# Patient Record
Sex: Male | Born: 1965 | Race: White | Hispanic: No | Smoking: Former smoker
Health system: Southern US, Community
[De-identification: ages and names within clinical notes are randomized; demographics above are authoritative.]

## PROBLEM LIST (undated history)

## (undated) DIAGNOSIS — F419 Anxiety disorder, unspecified: Secondary | ICD-10-CM

## (undated) DIAGNOSIS — D496 Neoplasm of unspecified behavior of brain: Secondary | ICD-10-CM

## (undated) DIAGNOSIS — I10 Essential (primary) hypertension: Secondary | ICD-10-CM

---

## 2003-01-01 ENCOUNTER — Emergency Department (HOSPITAL_COMMUNITY): Admission: EM | Admit: 2003-01-01 | Discharge: 2003-01-02 | Payer: Self-pay | Admitting: Emergency Medicine

## 2003-01-02 ENCOUNTER — Encounter: Payer: Self-pay | Admitting: Emergency Medicine

## 2003-01-08 ENCOUNTER — Emergency Department (HOSPITAL_COMMUNITY): Admission: EM | Admit: 2003-01-08 | Discharge: 2003-01-09 | Payer: Self-pay | Admitting: Emergency Medicine

## 2003-10-19 ENCOUNTER — Ambulatory Visit (HOSPITAL_COMMUNITY): Admission: RE | Admit: 2003-10-19 | Discharge: 2003-10-19 | Payer: Self-pay | Admitting: Family Medicine

## 2003-10-20 ENCOUNTER — Emergency Department (HOSPITAL_COMMUNITY): Admission: EM | Admit: 2003-10-20 | Discharge: 2003-10-20 | Payer: Self-pay

## 2003-10-20 ENCOUNTER — Inpatient Hospital Stay (HOSPITAL_COMMUNITY): Admission: AD | Admit: 2003-10-20 | Discharge: 2003-10-23 | Payer: Self-pay | Admitting: Internal Medicine

## 2003-10-21 ENCOUNTER — Encounter (INDEPENDENT_AMBULATORY_CARE_PROVIDER_SITE_OTHER): Payer: Self-pay | Admitting: *Deleted

## 2003-11-29 ENCOUNTER — Emergency Department (HOSPITAL_COMMUNITY): Admission: EM | Admit: 2003-11-29 | Discharge: 2003-11-29 | Payer: Self-pay | Admitting: Emergency Medicine

## 2003-12-10 ENCOUNTER — Ambulatory Visit (HOSPITAL_COMMUNITY): Admission: RE | Admit: 2003-12-10 | Discharge: 2003-12-10 | Payer: Self-pay | Admitting: Family Medicine

## 2003-12-11 ENCOUNTER — Emergency Department (HOSPITAL_COMMUNITY): Admission: EM | Admit: 2003-12-11 | Discharge: 2003-12-12 | Payer: Self-pay | Admitting: Emergency Medicine

## 2004-01-09 ENCOUNTER — Emergency Department (HOSPITAL_COMMUNITY): Admission: EM | Admit: 2004-01-09 | Discharge: 2004-01-10 | Payer: Self-pay

## 2004-01-10 ENCOUNTER — Emergency Department (HOSPITAL_COMMUNITY): Admission: EM | Admit: 2004-01-10 | Discharge: 2004-01-11 | Payer: Self-pay | Admitting: Emergency Medicine

## 2004-01-18 ENCOUNTER — Emergency Department (HOSPITAL_COMMUNITY): Admission: EM | Admit: 2004-01-18 | Discharge: 2004-01-18 | Payer: Self-pay | Admitting: Emergency Medicine

## 2004-02-03 ENCOUNTER — Emergency Department (HOSPITAL_COMMUNITY): Admission: EM | Admit: 2004-02-03 | Discharge: 2004-02-04 | Payer: Self-pay | Admitting: Emergency Medicine

## 2004-02-26 ENCOUNTER — Emergency Department (HOSPITAL_COMMUNITY): Admission: EM | Admit: 2004-02-26 | Discharge: 2004-02-27 | Payer: Self-pay | Admitting: Emergency Medicine

## 2004-05-18 ENCOUNTER — Emergency Department (HOSPITAL_COMMUNITY): Admission: EM | Admit: 2004-05-18 | Discharge: 2004-05-19 | Payer: Self-pay | Admitting: Emergency Medicine

## 2004-09-09 ENCOUNTER — Emergency Department (HOSPITAL_COMMUNITY): Admission: EM | Admit: 2004-09-09 | Discharge: 2004-09-09 | Payer: Self-pay | Admitting: Emergency Medicine

## 2006-05-16 ENCOUNTER — Emergency Department (HOSPITAL_COMMUNITY): Admission: EM | Admit: 2006-05-16 | Discharge: 2006-05-16 | Payer: Self-pay | Admitting: Emergency Medicine

## 2008-07-31 ENCOUNTER — Emergency Department (HOSPITAL_COMMUNITY): Admission: EM | Admit: 2008-07-31 | Discharge: 2008-07-31 | Payer: Self-pay | Admitting: Emergency Medicine

## 2008-09-30 ENCOUNTER — Emergency Department (HOSPITAL_COMMUNITY): Admission: EM | Admit: 2008-09-30 | Discharge: 2008-09-30 | Payer: Self-pay | Admitting: Emergency Medicine

## 2010-04-04 ENCOUNTER — Emergency Department (HOSPITAL_COMMUNITY)
Admission: EM | Admit: 2010-04-04 | Discharge: 2010-04-04 | Payer: Self-pay | Source: Home / Self Care | Admitting: Emergency Medicine

## 2010-06-28 ENCOUNTER — Encounter: Payer: Self-pay | Admitting: Family Medicine

## 2010-10-23 NOTE — H&P (Signed)
NAME:  Maurice Clay, WIENEKE                         ACCOUNT NO.:  [id number]   MEDICAL RECORD NO.:  [id number]                   PATIENT TYPE:  EMS   LOCATION:  MAJO                                 FACILITY:  [facility]   PHYSICIAN:  Candyce Churn, M.D.          DATE OF BIRTH:  1966/05/20   DATE OF ADMISSION:  10/20/2003  DATE OF DISCHARGE:                                HISTORY & PHYSICAL   CHIEF COMPLAINT:  Severe headache.   HISTORY OF PRESENT ILLNESS:  Maurice Clay is a very pleasant 45 year old male  with approximately two months of progressive headaches who now is having  episodic confusion, which is increasing and a low grade problem with  organizing thoughts. Initially was felt to have poor memory, but thinks  recently he has also had some auditory hallucinations. He saw Dr. Dorothe Pea  two days prior to admission and was given Percocet or Dilaudid for headache  and MRI was ordered. MRI had an abnormal right temporal lobe, which is not  entirely consistent with neoplasm and may be an inflammatory infiltrative  process. He is admitted now for pain control, further workup, and treatment  of nausea and vomiting.   PAST MEDICAL HISTORY:  1. Mild depression in the distant past. He was recently restarted on Elavil     for a depressed mood.  2. Episodic low back strain.  3. Tobacco use - the patient is a 40-pack-year smoker and continues to     smoke.   MEDICATIONS:  1. Klonopin 1 mg 1-2 times a day.  2. Dilaudid 2 mg p.r.n.  3. Elavil 25 mg at night for the last five to six months.  4. Atenolol 100 mg a day, which he did not tolerate well and now he was     given Verapamil 80 mg t.i.d. apparently two days ago, but he has not     filled this prescription yet.   ALLERGIES:  No known drug allergies.   PAST SURGICAL HISTORY:  None.   FAMILY HISTORY:  Father has metastatic melanoma. Two brothers and sisters  alive and well. Mother is okay.   POINT OF CONTACT:  His brother Barbara Cower.  He can be reached at telephone number  [phone].   SOCIAL HISTORY:  The patient is divorced. He has a girlfriend. He smokes as  above. He has rare alcohol use currently, somewhat heavy in the past. No  recreational drugs. 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   REVIEW OF SYSTEMS:  He has severe bitemporal headache. No fevers. He has an  occasional night sweat. His appetite is good. Denies double or blurred  vision. He did have some temporary numbness in his right upper extremity one  night recently, but resolved by the next day. No ataxia. Otherwise as per  HPI. Denies neck stiffness. No abdominal pain. No incontinence of bladder or  bowel. The patient denies any testicular lumps or nodules.   PHYSICAL  EXAMINATION:  GENERAL:  An alert male in no acute distress.  VITAL SIGNS: Temperature 97.8, pulse 98 and regular, respiratory rate 20 and  easy, blood pressure 120/79.  HEENT:  Atraumatic, normocephalic. Oropharynx is clear.  NECK:  Supple without mass or JVD. No lymphadenopathy noted.  CHEST:  Clear to auscultation.  CARDIAC:  Regular rhythm. No murmur, rub, or gallop.  ABDOMEN:  Soft, nontender. No hepatosplenomegaly.  EXTREMITIES:  Without cyanosis, clubbing, or edema.  NEUROLOGIC:  Oriented x3. Cranial nerves 2-12 intact. No obvious focal  deficits.   LABORATORY DATA:  Chest x-ray pending. CBC, CMET, urinalysis, PT/PTT, sed.  rate, VDRL, PPD, and LDH pending. MRI of the brain reveals right temporal  lobe area of possible inflammation versus neoplasm.   ASSESSMENT:  A 45 year old male with headaches and right temporal lobe  abnormality, questionable etiology. He is having some cognitive difficulties  and some peripheral symptoms.   I doubt HSV encephalitis in that he is not febrile and not paraplegic, and  not having any seizures. Could this be CNS lymphoma? Wonder if this may be  an infiltrative process.   PLAN:  1. Neurological consultation, Dr. Sandria Manly.  2. IV  Toradol for pain.  3. IV Phenergan for nausea.  4. X-ray, labs as above.  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                                                Candyce Churn, M.D.    RNG/MEDQ  D:  10/21/2003  T:  10/21/2003  Job:  [id number]   cc:   Jethro Bastos, M.D.  [address]  [place]  [place] [address]  Fax: [phone]   Genene Churn. Love, M.D.  [address]  [address]  [place]  [place] [address]  Fax: [phone]

## 2010-10-23 NOTE — Consult Note (Signed)
NAME:  Maurice Clay, Maurice Clay                         ACCOUNT NO.:  [id number]   MEDICAL RECORD NO.:  [id number]                   PATIENT TYPE:  INP   LOCATION:  3034                                 FACILITY:  [facility]   PHYSICIAN:  Genene Churn. Love, M.D.                 DATE OF BIRTH:  Dec 18, 1965   DATE OF CONSULTATION:  DATE OF DISCHARGE:                                   CONSULTATION   HISTORY OF PRESENT ILLNESS:  This is the first [facility] admission  for this 45 year old right-handed white single male with many years of  headaches, seen for evaluation of abnormal MRI's of the brain.   HISTORY OF PRESENT ILLNESS:  Mr. Maurice Clay has at least a 10-12 year history  of headaches, followed by Dr. Dorothe Pea, but much worse in the last 3 months.  Treated with verapamil 80 mg per day, Elavil 75 mg per day, Klonopin 1 mg  b.i.d., and this weekend oxycodone 1-2 q.4-6h. p.r.n. headache pain.  He  takes over-the-counter medications, at least 30 tablets per month, and it is  over the last 3 months.  He had a significant increase in headaches, which  he relates to stress.  He and his wife are getting divorced, and he has lost  visitation right-sided, and has had to go to court on many different  occasions.  He was seen by Dr. Dorothe Pea, who recommended an MRI study of the  brain, which was performed on Saturday evening, Oct 19, 2003, showing  evidence of a mass lesion in the right mid-temporal region without any  associated enhancement.  This could represent a herpes simplex encephalitis,  hamartoma, or possibly low-grade growing tumor.  The patient has had no  history of seizures, fever, hemiparesis, aphasia, ___________ syndrome, etc.  He denies any head or neck trauma.   PAST MEDICAL HISTORY:  He has a past history of heavy alcohol use, but has  not been drinking in many years.  He continues to smoke cigarettes, 2 packs  per day.  Otherwise, his health has been quite good.   MEDICATIONS:  1.  Verapamil 80 mg daily.  2. Klonopin 1 mg b.i.d.  3. Elavil 75 mg q.h.s.  4. This weekend oxycodone.   He has not had any strange odors, tastes, smells, deja vu, _____________, or  symptoms of seizures.  He has no other risk factors for tumor.   PHYSICAL EXAMINATION:  GENERAL:  A well-developed white male.  Thin.  VITAL SIGNS:  Blood pressure in the right and left arm of 130/80, heart rate  64 and regular.  NEUROLOGIC:  Mental status alert and oriented x3.  Followed 3-step commands.  Cranial nerve examination--visual fields full, disks flat.  Spontaneous  venous pulsation.  Extraocular movements full.  Corneal's present.  Facial  sensation, motor and hearing intact.  Air conduction greater than bone  conduction.  Tongue midline.  Uvula midline.  Gag is present.  Sternocleidomastoid and trapezius testing normal.  Motor examination with  5/5 strength proximally and distally in the left lower extremities.  No  evidence of proximal pronator or distal drift.  Coordination testing normal.  Sensory examination intact to pinprick, light touch, vibration testing.  Deep tendon reflexes were 1 to 2+.  Plantar response is downing.  Gait  examination with normal tandem toe and heel walking and Romberg testing  negative.   MEDICAL REVIEW OF SYSTEMS:  History of panic attack characterized by  sensations in which he needs to do things right away, and he will stop what  he is doing to go ahead and do them.   IMPRESSION:  1. 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  2. Migraine, code 346.0.  3. Abnormal MRI study of the brain, hamartoma versus tumor versus herpes     versus benign lesion, code 794.09.   PLAN:  The plan at this time is to repeat chest x-ray, because the initial  one showed question of pneumothorax, and obtain an MRI, spectroscopy and  EEG.                                               Genene Churn. Sandria Manly, M.D.    JML/MEDQ  D:  10/20/2003  T:  10/21/2003  Job:  [id number]

## 2010-10-23 NOTE — Discharge Summary (Signed)
NAME:  Maurice Clay, Maurice Clay                         ACCOUNT NO.:  [id number]   MEDICAL RECORD NO.:  [id number]                   PATIENT TYPE:  INP   LOCATION:  3034                                 FACILITY:  [facility]   PHYSICIAN:  Melissa L. Ladona Ridgel, MD               DATE OF BIRTH:  07-09-1965   DATE OF ADMISSION:  10/20/2003  DATE OF DISCHARGE:  10/23/2003                                 DISCHARGE SUMMARY   ADMITTING DIAGNOSES:  1. Severe headache with an abnormal MRI.  2. Mild depression.  3. Tobacco abuse.   DISCHARGE DIAGNOSES:  1. Temporal lobe lesion of unknown significance, possible neoplasm needs to     be ruled out with repeat study over time.  2. Migraine disease.  3. Tobacco abuse, at baseline.   MEDICATIONS AT TIME OF DISCHARGE:  1. Amitriptyline 25 mg 3 tablets nightly.  2. Atenolol 100 mg was to be started, however, this is on hold and the     patient will check with Dr. Jethro Bastos regarding this.  3. Klonopin 1 mg nightly.  4. Verapamil 80 mg once daily; this will also be held until the patient     speaks with Dr. Dorothe Pea.   FOLLOWUP:  His followup appointment is with Dr. Genene Churn. Love in 3 months  and with Dr. Dorothe Pea in 1 week to review the CSF studies.   HISTORY OF PRESENT ILLNESS:  The patient is a 45 year old white male who  presented to his primary care physician to work up progressively worsening  headaches and episodic confusion.  The patient's family noted that he was  having trouble organizing his thoughts and his memory was deteriorating.  Dr. Dorothe Pea prescribed appropriate pain medication and ordered an MRI.  The  MRI revealed an abnormal right temporal lobe lesion which could not be  differentiated between neoplasm versus an infiltrative process.  He  therefore was admitted to the hospital for further workup of his lesion and  control of his pain, as well as nausea and vomiting.   HOSPITAL COURSE:  The patient was admitted to the general medical  floor,  where he was seen by neurology.  The neurologist performed the spinal lumbar  puncture and appropriate labs were sent off to rule out inflammatory  process.  At the time of discharge, all had returned negative.  He underwent  an EEG which showed no signs or symptoms of seizure activity.  He underwent  chest x-ray which showed hyperinflation, chronic apical changes but no  infiltrate.  Initially, a tiny pneumothorax was thought to have been  present, however, repeat study showed that there was nothing along the lines  of a pneumothorax.  Further tests completed during the course of study were  an MR spectroscopy.  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  It was decided that the patient would follow up  as an outpatient to further elucidate the nature of his lesion.  At the time  of discharge, the patient's pertinent laboratory values were a TSH of 3.6.  His initial CSF evaluation was colorless, clear, 0 red cells, 2 white cells,  too-few-to-count neutrophils.  His oligoclonal band analysis was all within  normal limits.  Protein in the CSF was 34, glucose was 60, albumin was not  observed.  VDRL was nonreactive and the ACE study was 1.6, which was within  normal limits.  Blood cultures and fluid cultures remained negative.  His  herpes simplex PCR returned negative post discharge.  The CSF analysis for  cytology showed no malignant cells.  On the day of discharge, the patient's  vital signs were stable.  His general physical exam revealed normocephalic,  atraumatic, in no acute distress.  He denied headache.  His pupils were  equal, round and reactive to light.  Extraocular movements were intact and  mucous membranes were moist.  His neck was supple.  There was no JVD and no  lymph nodes.  Chest was clear to auscultation but distant.  Abdomen was  soft, nontender and  nondistended with positive bowel sounds.  There was no  hepatosplenomegaly.  There was no peripheral edema.  The patient had  multiple tattoos.  His neurological exam was nonfocal and he appeared  oriented to time, place and person.  The patient was instructed on the  importance of following up with Dr. Sandria Manly and his primary care physician, as  the formal diagnosis of a brain tumor had not been reached but was a serious  consideration in his case.                                                Melissa L. Ladona Ridgel, MD    MLT/MEDQ  D:  11/07/2003  T:  11/09/2003  Job:  [id number]   cc:   Jethro Bastos, M.D.  [address]  [place]  [place] [address]  Fax: [phone]   Genene Churn. Love, M.D.  [address]  [address]  [place]  [place] [address]  Fax: [phone]

## 2010-10-23 NOTE — Procedures (Signed)
REFERRING PHYSICIANS:  1. Hollice Espy, M.D.  2. Genene Churn. Love, M.D.   Rule out seizure of other abnormalities in a 45 year old with headaches and  abnormality on MRI in the right temporal lobe.   This is a routine 17-channel EEG with one channel devoted to EKG utilizing  the international 10/20 lead placement system.  The patient was awake during  the study clinically and electrographically.  The background consisted of  well-organized, well-developed, well-modulated 10 Hz alpha activity which is  predominant in posterior head region and reactive to eye opening as well as  to photic stimulation at several flash frequencies.  There was also an  underlying beta activity noted throughout the study which may be related to  medication.  Some artifact was noted from the P4 electrode but that was  cleared up shortly into the study.  Photic stimulation produced photic  driving occipitally at several flash frequencies, hyperventilation did not  produce significant change in background activity.  No interhemispheric  asymmetry is identified and no definite epileptiform discharges are noted.  No irritability is noted from the right temporal region.  EKG monitor  reveals relatively regular rhythm with a rate of 60 beats per minute.   CONCLUSION:  Essentially normal awake EEG without focal abnormality or  seizure like activity seen during the course of today's recording.  There  was some beta activity  noted which is likely related to medication.  Clinical correlation is recommended.    Tyler Deis, M.D.   ZOX:WRUE  D:  10/21/2003 12:36:04  T:  10/21/2003 14:12:20  Job #:  [id number]   cc:   Genene Churn. Love, M.D.  [address]  [address]  [place]  [place] [address]  Fax: [phone]

## 2010-10-23 NOTE — Op Note (Signed)
NAME:  Maurice Clay, Maurice Clay                         ACCOUNT NO.:  [id number]   MEDICAL RECORD NO.:  [id number]                   PATIENT TYPE:  INP   LOCATION:  3034                                 FACILITY:  [facility]   PHYSICIAN:  Santina Evans A. Orlin Hilding, M.D.          DATE OF BIRTH:  Dec 15, 1965   DATE OF PROCEDURE:  10/21/2003  DATE OF DISCHARGE:                                 OPERATIVE REPORT   INDICATIONS FOR PROCEDURE:  This is a 45 year old with an abnormality in the  right mediotemporal lobe by MRI with headaches.  MRI spectroscopy was  completed, but results are pending.   PROCEDURE:  Lumbar puncture.   PREOPERATIVE DIAGNOSIS:  Diagnostic, rule out herpes simplex encephalitis,  neoplasm, etc.   The procedure was explained along with risks of possible headache and  benefits of potential diagnosis.  Consent was obtained.   DESCRIPTION OF PROCEDURE:  The patient was sterilely prepped and draped and  positioned in the usual fashion in the left lateral decubitus position.  The  L4-5 level was identified and locally anesthetized with 1% Xylocaine.  The  thecal sac was entered without difficulty with an opening pressure of 17.  Approximately 12 mL of clear, colorless, fluid were obtained and sent to the  lab for cell count, differential, protein, glucose, VDRL, ACE level, PCR for  herpes simplex virus, gram stain culture, oligoclonal band, IgG, albumin  index, cytology.  The patient tolerated the procedure well with no immediate  complications.  Will lie in Trendelenburg for 30 minutes.                                               Catherine A. Orlin Hilding, M.D.    CAW/MEDQ  D:  10/21/2003  T:  10/21/2003  Job:  [id number]   cc:   Jethro Bastos, M.D.  [address]  [place]  [place] [address]  Fax: [phone]

## 2011-03-23 ENCOUNTER — Emergency Department (HOSPITAL_COMMUNITY): Payer: Self-pay

## 2011-03-23 ENCOUNTER — Emergency Department (HOSPITAL_COMMUNITY)
Admission: EM | Admit: 2011-03-23 | Discharge: 2011-03-23 | Disposition: A | Discharge status: Home / Self Care | Payer: Self-pay | Attending: Emergency Medicine | Admitting: Emergency Medicine

## 2011-03-23 DIAGNOSIS — D496 Neoplasm of unspecified behavior of brain: Secondary | ICD-10-CM | POA: Insufficient documentation

## 2011-03-23 DIAGNOSIS — R52 Pain, unspecified: Secondary | ICD-10-CM | POA: Insufficient documentation

## 2011-03-23 DIAGNOSIS — R51 Headache: Secondary | ICD-10-CM | POA: Insufficient documentation

## 2011-03-23 DIAGNOSIS — R4182 Altered mental status, unspecified: Secondary | ICD-10-CM | POA: Insufficient documentation

## 2011-03-23 DIAGNOSIS — R062 Wheezing: Secondary | ICD-10-CM | POA: Insufficient documentation

## 2011-03-23 LAB — DIFFERENTIAL
Basophils Absolute: 0.1 10*3/uL (ref 0.0–0.1)
Eosinophils Relative: 4 % (ref 0–5)
Lymphs Abs: 3.2 10*3/uL (ref 0.7–4.0)

## 2011-03-23 LAB — COMPREHENSIVE METABOLIC PANEL
ALT: <5 U/L (ref 0–53)
CO2: 27 mEq/L (ref 19–32)
Chloride: 102 mEq/L (ref 96–112)
GFR calc non Af Amer: >90 mL/min (ref >90)
Glucose, Bld: 82 mg/dL (ref 70–99)
Potassium: 3.6 mEq/L (ref 3.5–5.1)
Total Protein: 7.4 g/dL (ref 6.0–8.3)

## 2011-03-23 LAB — URINALYSIS, ROUTINE W REFLEX MICROSCOPIC
Glucose, UA: NEGATIVE mg/dL
Hgb urine dipstick: NEGATIVE
Specific Gravity, Urine: 1.013 (ref 1.005–1.030)
Urobilinogen, UA: 0.2 mg/dL (ref 0.0–1.0)

## 2011-03-23 LAB — ACETAMINOPHEN LEVEL: Acetaminophen (Tylenol), Serum: <15 ug/mL (ref 10–30)

## 2011-03-23 LAB — CBC
HCT: 37.9 % — ABNORMAL LOW (ref 39.0–52.0)
Hemoglobin: 12.3 g/dL — ABNORMAL LOW (ref 13.0–17.0)
MCH: 28.6 pg (ref 26.0–34.0)
Platelets: 275 10*3/uL (ref 150–400)
RBC: 4.3 MIL/uL (ref 4.22–5.81)

## 2011-03-23 LAB — RAPID URINE DRUG SCREEN, HOSP PERFORMED: Benzodiazepines: NOT DETECTED

## 2011-03-23 LAB — POCT I-STAT TROPONIN I: Troponin i, poc: 0 ng/mL (ref 0.00–0.08)

## 2011-03-23 LAB — POCT I-STAT, CHEM 8
Chloride: 105 mEq/L (ref 96–112)
Creatinine, Ser: 0.8 mg/dL (ref 0.50–1.35)
HCT: 43 % (ref 39.0–52.0)
Hemoglobin: 14.6 g/dL (ref 13.0–17.0)
Sodium: 140 mEq/L (ref 135–145)

## 2011-03-23 MED ORDER — GADOBENATE DIMEGLUMINE 529 MG/ML IV SOLN
12.0000 mL | Freq: Once | INTRAVENOUS | Status: AC | PRN
  Administered 2011-03-23: 12 mL via INTRAVENOUS

## 2012-10-18 ENCOUNTER — Inpatient Hospital Stay (HOSPITAL_BASED_OUTPATIENT_CLINIC_OR_DEPARTMENT_OTHER)
Admission: EM | Admit: 2012-10-18 | Discharge: 2012-10-20 | DRG: 896 | Disposition: A | Discharge status: Home / Self Care | Payer: MEDICAID | Attending: Internal Medicine | Admitting: Internal Medicine

## 2012-10-18 ENCOUNTER — Emergency Department (HOSPITAL_BASED_OUTPATIENT_CLINIC_OR_DEPARTMENT_OTHER): Payer: Self-pay

## 2012-10-18 ENCOUNTER — Encounter (HOSPITAL_BASED_OUTPATIENT_CLINIC_OR_DEPARTMENT_OTHER): Payer: Self-pay | Admitting: *Deleted

## 2012-10-18 DIAGNOSIS — E860 Dehydration: Secondary | ICD-10-CM | POA: Diagnosis present

## 2012-10-18 DIAGNOSIS — G43109 Migraine with aura, not intractable, without status migrainosus: Secondary | ICD-10-CM | POA: Diagnosis present

## 2012-10-18 DIAGNOSIS — R220 Localized swelling, mass and lump, head: Secondary | ICD-10-CM | POA: Diagnosis present

## 2012-10-18 DIAGNOSIS — F17200 Nicotine dependence, unspecified, uncomplicated: Secondary | ICD-10-CM | POA: Diagnosis present

## 2012-10-18 DIAGNOSIS — G9340 Encephalopathy, unspecified: Secondary | ICD-10-CM

## 2012-10-18 DIAGNOSIS — I10 Essential (primary) hypertension: Secondary | ICD-10-CM | POA: Diagnosis present

## 2012-10-18 DIAGNOSIS — G939 Disorder of brain, unspecified: Secondary | ICD-10-CM

## 2012-10-18 DIAGNOSIS — R569 Unspecified convulsions: Secondary | ICD-10-CM

## 2012-10-18 DIAGNOSIS — R410 Disorientation, unspecified: Secondary | ICD-10-CM

## 2012-10-18 DIAGNOSIS — F19921 Other psychoactive substance use, unspecified with intoxication with delirium: Principal | ICD-10-CM | POA: Diagnosis present

## 2012-10-18 DIAGNOSIS — IMO0002 Reserved for concepts with insufficient information to code with codable children: Secondary | ICD-10-CM | POA: Diagnosis present

## 2012-10-18 DIAGNOSIS — Z9114 Patient's other noncompliance with medication regimen: Secondary | ICD-10-CM

## 2012-10-18 DIAGNOSIS — D72829 Elevated white blood cell count, unspecified: Secondary | ICD-10-CM | POA: Diagnosis present

## 2012-10-18 DIAGNOSIS — T380X1A Poisoning by glucocorticoids and synthetic analogues, accidental (unintentional), initial encounter: Secondary | ICD-10-CM | POA: Diagnosis present

## 2012-10-18 DIAGNOSIS — Z9119 Patient's noncompliance with other medical treatment and regimen: Secondary | ICD-10-CM

## 2012-10-18 DIAGNOSIS — F319 Bipolar disorder, unspecified: Secondary | ICD-10-CM | POA: Diagnosis present

## 2012-10-18 DIAGNOSIS — Z91199 Patient's noncompliance with other medical treatment and regimen due to unspecified reason: Secondary | ICD-10-CM

## 2012-10-18 DIAGNOSIS — G9389 Other specified disorders of brain: Secondary | ICD-10-CM

## 2012-10-18 DIAGNOSIS — R451 Restlessness and agitation: Secondary | ICD-10-CM

## 2012-10-18 DIAGNOSIS — R4182 Altered mental status, unspecified: Secondary | ICD-10-CM

## 2012-10-18 HISTORY — DX: Essential (primary) hypertension: I10

## 2012-10-18 HISTORY — DX: Neoplasm of unspecified behavior of brain: D49.6

## 2012-10-18 LAB — CBC WITH DIFFERENTIAL/PLATELET
Basophils Relative: 1 % (ref 0–1)
HCT: 39.4 % (ref 39.0–52.0)
Lymphocytes Relative: 39 % (ref 12–46)
MCHC: 35 g/dL (ref 30.0–36.0)
Monocytes Absolute: 0.7 10*3/uL (ref 0.1–1.0)
Neutro Abs: 4.4 10*3/uL (ref 1.7–7.7)
RBC: 4.57 MIL/uL (ref 4.22–5.81)
RDW: 14.1 % (ref 11.5–15.5)
WBC: 9 10*3/uL (ref 4.0–10.5)

## 2012-10-18 LAB — COMPREHENSIVE METABOLIC PANEL
ALT: 6 U/L (ref 0–53)
AST: 19 U/L (ref 0–37)
Alkaline Phosphatase: 66 U/L (ref 39–117)
Calcium: 9.7 mg/dL (ref 8.4–10.5)
Chloride: 103 mEq/L (ref 96–112)
Glucose, Bld: 110 mg/dL — ABNORMAL HIGH (ref 70–99)
Potassium: 4.1 mEq/L (ref 3.5–5.1)
Total Bilirubin: 0.2 mg/dL — ABNORMAL LOW (ref 0.3–1.2)

## 2012-10-18 LAB — LACTIC ACID, PLASMA: Lactic Acid, Venous: 0.9 mmol/L (ref 0.5–2.2)

## 2012-10-18 LAB — URINALYSIS, ROUTINE W REFLEX MICROSCOPIC

## 2012-10-18 LAB — ETHANOL: Alcohol, Ethyl (B): <11 mg/dL (ref 0–11)

## 2012-10-18 MED ORDER — DEXAMETHASONE SODIUM PHOSPHATE 10 MG/ML IJ SOLN
10.0000 mg | Freq: Once | INTRAMUSCULAR | Status: AC
  Administered 2012-10-18: 10 mg via INTRAVENOUS
  Filled 2012-10-18: qty 1

## 2012-10-18 MED ORDER — KETOROLAC TROMETHAMINE 15 MG/ML IJ SOLN
15.0000 mg | Freq: Once | INTRAMUSCULAR | Status: AC
  Administered 2012-10-18: 15 mg via INTRAVENOUS
  Filled 2012-10-18: qty 1

## 2012-10-18 MED ORDER — PROMETHAZINE HCL 25 MG/ML IJ SOLN
25.0000 mg | Freq: Once | INTRAMUSCULAR | Status: AC
  Administered 2012-10-18: 25 mg via INTRAVENOUS
  Filled 2012-10-18: qty 1

## 2012-10-18 NOTE — ED Notes (Signed)
Patient transported to CT 

## 2012-10-18 NOTE — ED Notes (Signed)
Pt reports h/a , toothache , sister states confusion x 1 day

## 2012-10-18 NOTE — ED Provider Notes (Signed)
History     CSN: [id number]  Arrival date & time 10/18/12  2144   First MD Initiated Contact with Patient 10/18/12 2301      Chief Complaint  Patient presents with  . Altered Mental Status  . Dental Pain  . Headache   HPI Maurice Clay is a 47 y.o. male presents with altered mental status and confusion times one day. 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  Last position was seen 3 years ago.  Patient is fully functional, he lives by himself, he holds a job at the same employer or his sister works. Recently, over the last day, she has noted in acting more strangely, and "talking out of his head."  Patient has made reference to seeing things that are not there, and inappropriate comments based on the conversation at hand. Patient has no complaints about chest pain, shortness of breath, productive cough, dysuria, abdominal pain, nausea, vomiting, diarrhea, or rash. Patient has complained about tooth pain and headache. Patient's headache is throbbing, on the right side of the head, similar to prior headaches in the past, mild photophobia.  Past Medical History  Diagnosis Date  . Brain tumor   . Hypertension     History reviewed. No pertinent past surgical history.  History reviewed. No pertinent family history.  History  Substance Use Topics  . Smoking status: Current Every Day Smoker -- 0.50 packs/day    Types: Cigarettes  . Smokeless tobacco: Not on file  . Alcohol Use: No      Review of Systems At least 10pt or greater review of systems completed and are negative except where specified in the HPI.  Allergies  Tramadol  Home Medications  No current outpatient prescriptions on file.  BP 106/65  Pulse 91  Temp(Src) 98.6 F (37 C) (Oral)  Resp 16  Ht 5\' 10"  (1.778 m)  Wt 130 lb (58.968 kg)  BMI 18.65  kg/m2  SpO2 100%  Physical Exam  PHYSICAL EXAM: VITAL SIGNS:  . Filed Vitals:   10/19/12 0155 10/19/12 0300 10/19/12 0309 10/19/12 0325  BP: 120/72 110/72 122/80   Pulse: 111 108 102 74  Temp:  98.5 F (36.9 C)    TempSrc:      Resp: 24 22 24    Height:      Weight:      SpO2: 98% 98% 98% 94%   CONSTITUTIONAL: Awake, orientedx2, appears non-toxic HENT: Atraumatic, normocephalic, oral mucosa pink and moist, airway patent. Poor dentition. Nares patent without drainage. External ears normal. EYES: Conjunctiva clear, EOMI, PERRLA NECK: Trachea midline, non-tender, supple CARDIOVASCULAR: Normal heart rate, Normal rhythm, No murmurs, rubs, gallops PULMONARY/CHEST: Clear to auscultation, no rhonchi, wheezes, or rales. Symmetrical breath sounds. CHEST WALL: No lesions. Non-tender. ABDOMINAL: Non-distended, soft, non-tender - no rebound or guarding.  BS normal. NEUROLOGIC: NW:GNFAOZ fields intact. PERRLA, EOMI.  Facial sensation equal to light touch bilaterally.  Good muscle bulk in the masseter muscle and good lateral movement of the jaw.  Facial expressions equal and good strength with smile/frown and puffed cheeks.  Hearing grossly intact to finger rub test.  Uvula, tongue are midline with no deviation. Symmetrical palate elevation.  Trapezius and SCM muscles are 5/5 strength bilaterally.   DTR: Brachioradialis, biceps, patellar, Achilles tendon reflexes 2+ bilaterally.  No clonus. Strength: 5/5 strength flexors and extensors in the  upper and lower extremities.  Grip strength, finger adduction/abduction 5/5. Sensation: Sensation intact distally to light touch, proprioception using position testing of 2nd digit and great toe Cerebellar: No ataxia with walking - has a wider based gait than expected , no dysmetria with finger to nose, rapid alternating hand movements and heels to shin testing. Gait and Station: Patient is able to walk on heels and the toes, unable to tandem gait EXTREMITIES:  No clubbing, cyanosis, or edema SKIN: Warm, Dry, No erythema, No rash Psychiatric: Flat affect, mood congruent, occasional tangential or inappropriate statements  ED Course  Procedures (including critical care time) CRITICAL CARE Performed by: Jones Skene Total critical care time: 45 Critical care time was exclusive of separately billable procedures and treating other patients. Critical care was necessary to treat or prevent imminent or life-threatening deterioration.  Altered mental status and delirium. Critical care was time spent personally by me on the following activities: development of treatment plan with patient and/or surrogate as well as nursing, discussions with consultants, evaluation of patient's response to treatment, examination of patient, obtaining history from patient or surrogate, ordering and performing treatments and interventions, ordering and review of laboratory studies, ordering and review of radiographic studies, pulse oximetry and re-evaluation of patient's condition.  Labs Reviewed  COMPREHENSIVE METABOLIC PANEL - Abnormal; Notable for the following:    Glucose, Bld 110 (*)    Total Bilirubin 0.2 (*)    All other components within normal limits  URINE RAPID DRUG SCREEN (HOSP PERFORMED) - Abnormal; Notable for the following:    Benzodiazepines POSITIVE (*)    All other components within normal limits  CBC WITH DIFFERENTIAL  URINALYSIS, ROUTINE W REFLEX MICROSCOPIC  ETHANOL  LACTIC ACID, PLASMA  AMMONIA   Ct Head Wo Contrast  10/18/2012   *RADIOLOGY REPORT*  Clinical Data: Altered mental status.  Headaches.  CT HEAD WITHOUT CONTRAST  Technique:  Contiguous axial images were obtained from the base of the skull through the vertex without contrast.  Comparison: 02/26/2004 CT.  MRI 03/23/2011  Findings: No acute intracranial abnormality.  Specifically, no hemorrhage, hydrocephalus, mass lesion, acute infarction, or significant intracranial injury.  No acute  calvarial abnormality. Mastoid air cells are clear.  Orbital soft tissues unremarkable.  IMPRESSION: No acute intracranial abnormality.   Original Report Authenticated By: Charlett Nose, M.D.     1. Altered mental status   2. Delirium   3. Brain lesion     Medications  ketorolac (TORADOL) 15 MG/ML injection 15 mg (15 mg Intravenous Given 10/18/12 2350)  dexamethasone (DECADRON) injection 10 mg (10 mg Intravenous Given 10/18/12 2350)  promethazine (PHENERGAN) injection 25 mg (25 mg Intravenous Given 10/18/12 2351)  lidocaine-EPINEPHrine (XYLOCAINE W/EPI) 2 %-1:100000 (with pres) injection 20 mL (20 mLs Infiltration Given 10/19/12 0018)  diphenhydrAMINE (BENADRYL) injection 25 mg (25 mg Intravenous Given 10/19/12 0017)  haloperidol lactate (HALDOL) injection 5 mg (5 mg Intravenous Given 10/19/12 0030)  haloperidol lactate (HALDOL) injection 5 mg (5 mg Intravenous Given 10/19/12 0112)  midazolam (VERSED) injection 2 mg (2 mg Intravenous Given 10/19/12 0112)  midazolam (VERSED) injection 2 mg (2 mg Intravenous Given 10/19/12 0120)  ziprasidone (GEODON) injection 10 mg (10 mg Intramuscular Given 10/19/12 0225)  midazolam (VERSED) injection 2 mg (2 mg Intravenous Given 10/19/12 0305)     MDM  Maurice Clay is a 47 y.o. male presenting with headache, altered mental status. Patient is alert, oriented to himself, knows he is in the emergency department and knows the year, patient is uncertain  of the date.  Patient had made strange comments to triage staff and will occasionally start talking to himself outside the conversation. He is known known history of bipolar disease, schizophrenia or visual hallucinations.  She does have a history of auditory hallucinations when he was first diagnosed with brain lesions in 2005.  In reviewing the patient's record, it is uncertain what kind of brain lesion he actually has, this could be a small low grade tumor and hippocampal region on the right versus encephalitis scar  tissue.  The patient has no symptoms of focal neurologic deficit, no symptoms of increased intracranial pressure. He is afebrile and nontoxic in appearance, I do not think he has got an acute encephalitis or meningitis-he has no meningeal symptoms.  Patient's labs are unremarkable, he's got a negative lactate, no evidence of infection by white count, urine, chest x-ray-CT of the head is read as normal.  Plan was to perform LP to definitively rule out meningitis versus encephalitis, however patient became increasingly delirious and agitated.  Patient became difficult to control, would not follow instructions by nursing staff, attempted to strike nursing staff, was determined to be a threat to himself and staff and so required chemical and physical restraints.  Patient has had some intermittent tachycardia, I think this is secondary to agitation, he is still afebrile, blood pressures have been within normal limits.  Discussed patient's admission with Dr. Alvester Morin, and I discussed a neuro consult with Dr. Roseanne Reno.  Patient is given Decadron for headache, Phenergan as well as his headache definitely sounded migraine-like as his headaches in the past have also.  Patient will require further inpatient management, diagnostics and treatment. Patient will be transferred to [facility] for admission to the step down unit.           Jones Skene, MD 10/19/12 1610

## 2012-10-19 ENCOUNTER — Emergency Department (HOSPITAL_BASED_OUTPATIENT_CLINIC_OR_DEPARTMENT_OTHER): Payer: Self-pay

## 2012-10-19 ENCOUNTER — Inpatient Hospital Stay (HOSPITAL_COMMUNITY): Payer: Self-pay

## 2012-10-19 DIAGNOSIS — R4182 Altered mental status, unspecified: Secondary | ICD-10-CM

## 2012-10-19 DIAGNOSIS — R569 Unspecified convulsions: Secondary | ICD-10-CM | POA: Diagnosis present

## 2012-10-19 DIAGNOSIS — R451 Restlessness and agitation: Secondary | ICD-10-CM | POA: Diagnosis present

## 2012-10-19 DIAGNOSIS — Z9114 Patient's other noncompliance with medication regimen: Secondary | ICD-10-CM

## 2012-10-19 DIAGNOSIS — D72829 Elevated white blood cell count, unspecified: Secondary | ICD-10-CM | POA: Diagnosis present

## 2012-10-19 DIAGNOSIS — I10 Essential (primary) hypertension: Secondary | ICD-10-CM | POA: Diagnosis present

## 2012-10-19 DIAGNOSIS — E860 Dehydration: Secondary | ICD-10-CM | POA: Diagnosis present

## 2012-10-19 DIAGNOSIS — R410 Disorientation, unspecified: Secondary | ICD-10-CM | POA: Diagnosis present

## 2012-10-19 DIAGNOSIS — Z91148 Patient's other noncompliance with medication regimen for other reason: Secondary | ICD-10-CM

## 2012-10-19 DIAGNOSIS — G9389 Other specified disorders of brain: Secondary | ICD-10-CM | POA: Diagnosis present

## 2012-10-19 DIAGNOSIS — G9340 Encephalopathy, unspecified: Secondary | ICD-10-CM | POA: Diagnosis present

## 2012-10-19 LAB — CREATININE, SERUM
GFR calc Af Amer: >90 mL/min (ref >90)
GFR calc non Af Amer: >90 mL/min (ref >90)

## 2012-10-19 LAB — CBC
MCV: 84.1 fL (ref 78.0–100.0)
Platelets: 242 10*3/uL (ref 150–400)
RDW: 14 % (ref 11.5–15.5)
WBC: 15.9 10*3/uL — ABNORMAL HIGH (ref 4.0–10.5)

## 2012-10-19 LAB — RAPID URINE DRUG SCREEN, HOSP PERFORMED
Amphetamines: NOT DETECTED
Benzodiazepines: POSITIVE — AB
Cocaine: NOT DETECTED

## 2012-10-19 MED ORDER — MIDAZOLAM HCL 2 MG/2ML IJ SOLN
INTRAMUSCULAR | Status: AC
  Administered 2012-10-19: 2 mg via INTRAVENOUS
  Filled 2012-10-19: qty 2

## 2012-10-19 MED ORDER — MIDAZOLAM HCL 2 MG/2ML IJ SOLN
INTRAMUSCULAR | Status: AC
  Filled 2012-10-19: qty 2

## 2012-10-19 MED ORDER — ZIPRASIDONE MESYLATE 20 MG IM SOLR
10.0000 mg | Freq: Once | INTRAMUSCULAR | Status: AC
  Administered 2012-10-19: 10 mg via INTRAMUSCULAR
  Filled 2012-10-19: qty 20

## 2012-10-19 MED ORDER — MIDAZOLAM HCL 2 MG/2ML IJ SOLN
2.0000 mg | Freq: Once | INTRAMUSCULAR | Status: AC
  Administered 2012-10-19: 2 mg via INTRAVENOUS
  Filled 2012-10-19: qty 2

## 2012-10-19 MED ORDER — HEPARIN SODIUM (PORCINE) 5000 UNIT/ML IJ SOLN
5000.0000 [IU] | Freq: Three times a day (TID) | INTRAMUSCULAR | Status: DC
  Administered 2012-10-19 – 2012-10-20 (×3): 5000 [IU] via SUBCUTANEOUS
  Filled 2012-10-19 (×6): qty 1

## 2012-10-19 MED ORDER — LIDOCAINE-EPINEPHRINE 2 %-1:100000 IJ SOLN
20.0000 mL | Freq: Once | INTRAMUSCULAR | Status: AC
  Administered 2012-10-19: 20 mL
  Filled 2012-10-19: qty 1

## 2012-10-19 MED ORDER — HALOPERIDOL LACTATE 5 MG/ML IJ SOLN
5.0000 mg | Freq: Once | INTRAMUSCULAR | Status: AC
  Administered 2012-10-19: 5 mg via INTRAVENOUS

## 2012-10-19 MED ORDER — ZIPRASIDONE MESYLATE 20 MG IM SOLR
INTRAMUSCULAR | Status: AC
  Filled 2012-10-19: qty 20

## 2012-10-19 MED ORDER — LACTATED RINGERS IV BOLUS (SEPSIS)
1000.0000 mL | Freq: Once | INTRAVENOUS | Status: AC
  Administered 2012-10-19: 1000 mL via INTRAVENOUS

## 2012-10-19 MED ORDER — SODIUM CHLORIDE 0.9 % IV SOLN
INTRAVENOUS | Status: DC
  Administered 2012-10-19: 07:00:00 via INTRAVENOUS
  Administered 2012-10-19: 100 mL/h via INTRAVENOUS

## 2012-10-19 MED ORDER — DIPHENHYDRAMINE HCL 50 MG/ML IJ SOLN
25.0000 mg | Freq: Once | INTRAMUSCULAR | Status: AC
  Administered 2012-10-19: 25 mg via INTRAVENOUS
  Filled 2012-10-19: qty 1

## 2012-10-19 MED ORDER — ZIPRASIDONE MESYLATE 20 MG IM SOLR
10.0000 mg | Freq: Once | INTRAMUSCULAR | Status: AC
  Administered 2012-10-19: 10 mg via INTRAMUSCULAR

## 2012-10-19 MED ORDER — HALOPERIDOL LACTATE 5 MG/ML IJ SOLN
INTRAMUSCULAR | Status: AC
  Administered 2012-10-19: 5 mg via INTRAVENOUS
  Filled 2012-10-19: qty 1

## 2012-10-19 MED ORDER — HALOPERIDOL LACTATE 5 MG/ML IJ SOLN
INTRAMUSCULAR | Status: AC
  Filled 2012-10-19: qty 1

## 2012-10-19 MED ORDER — THIAMINE HCL 100 MG/ML IJ SOLN
500.0000 mg | Freq: Three times a day (TID) | INTRAMUSCULAR | Status: DC
  Filled 2012-10-19 (×3): qty 5

## 2012-10-19 MED ORDER — MIDAZOLAM HCL 2 MG/2ML IJ SOLN: 2.0000 mg | Freq: Once | INTRAMUSCULAR | Status: AC

## 2012-10-19 MED ORDER — HALOPERIDOL LACTATE 5 MG/ML IJ SOLN: 5.0000 mg | Freq: Once | INTRAMUSCULAR | Status: AC

## 2012-10-19 MED ORDER — THIAMINE HCL 100 MG/ML IJ SOLN
500.0000 mg | Freq: Three times a day (TID) | INTRAVENOUS | Status: DC
  Administered 2012-10-19 – 2012-10-20 (×4): 500 mg via INTRAVENOUS
  Filled 2012-10-19 (×9): qty 5

## 2012-10-19 MED ORDER — NICOTINE 21 MG/24HR TD PT24
21.0000 mg | MEDICATED_PATCH | Freq: Every day | TRANSDERMAL | Status: DC
  Administered 2012-10-19 – 2012-10-20 (×2): 21 mg via TRANSDERMAL
  Filled 2012-10-19 (×3): qty 1

## 2012-10-19 MED ORDER — MIDAZOLAM HCL 2 MG/2ML IJ SOLN
2.0000 mg | Freq: Once | INTRAMUSCULAR | Status: AC
  Administered 2012-10-19: 2 mg via INTRAVENOUS

## 2012-10-19 NOTE — Progress Notes (Signed)
Utilization review completed.  

## 2012-10-19 NOTE — Progress Notes (Signed)
[facility]   Progress Note Maurice Clay TEAM 1 - Stepdown/ICU TEAM   Gery Sabedra EAV:[id number] DOB: 1966-02-26 DOA: 10/18/2012 PCP: No primary provider on file.  Brief narrative:  47 year old male patient with documented right temporal lobe lesion seen on MRI in 2005 year he also apparently bipolar disorder. Presented from local urgent care after being brought in by family because of confusion for about 24 hours. When he presented to urgent care he was confused but not aggressive or agitated. Immediately prior to onset of agitation an LP was planned but was unable to be completed because of patient's became acutely agitated and combative. He was aggressive enough that he required chemical and physical restraints. Upon presentation to [facility] he was afebrile and had no leukocytosis. At some point patient complained of headache and was given a migraine cocktail that included Decadron and this appears to have been given at the urgent care and prior to onset of patient's agitation symptoms.  Assessment/Plan: Active Problems:   Acute delirium/Agitation requiring sedation protocol/Acute encephalopathy -Required Geodon, Haldol and Versed in the emergency department  -sister endorses pt has had similar spells in past after increased personal stressors- had recent personal stress surrounding denial of access to his mother on Mother's Day -pt also has psych dx and did receive steroid prior to agitation so meds could have precipitated AMS AFTER presenationt to urgent care -alert and appropriate upon exam today though still anxious and wants to dc- no insight into current medical status   History Bipolar d/o -may need psych eval vs OP follow up    Dehydration/Leukocytosis -cont IVF- WBC resolved and no obvious source of infection appreciated -cont IVF and start diet since alert    Right temporal lobe mass/DX'D 2005 - ? New onset seizure -also had MS changes preadmit that could reflect  progression of known temporal lobe lesion or possible occult seizures -pt has not takem ANY meds for several years (see below) -appreciate neurology assistance -agree with plans for MRI and EEG   Forehead skin lesion -pt calls this "skin cancer" but has not been bx'd or diagnosed and has been present x 10 years -looks like area of repeated trauma with healed hypertrophied tissue border- likely bx at some point would be beneficial    History of  HTN (hypertension) -currently normotensive OFF meds- follow    Noncompliance with medication treatment due to underuse of medication -has itinerate work thru sister and no insurance -Case management consulted -likely needs disability so either needs primary psych or neuro OP to make determination and complete paperwork -last known med list is from 2005 admit: Elavil 25mg - 3 tabs HS, Atenolol held due to low BP, Klonopin 1 mg HS, Verapamil 80 mg daily also held- previously followed by Dorothe Pea (PCP) and Love (neuro) but has NO INSURANCE.   DVT prophylaxis: Subcutaneous heparin Code Status: Full Family Communication: Patient and sister at bedside Disposition Plan: Stepdown Isolation: None  Consultants: Neurology  Procedures: EEG  Antibiotics: None  HPI/Subjective: Earlier today patient was still somewhat sedated but later became alert and essentially oriented. Is becoming more agitated because of restraints so these were removed. Currently no complaints of chest pain or shortness of breath and no headache.   Objective: Blood pressure 117/65, pulse 74, temperature 98 F (36.7 C), temperature source Axillary, resp. rate 24, height 5\' 10"  (1.778 m), weight 58.968 kg (130 lb), SpO2 99.00%.  Intake/Output Summary (Last 24 hours) at 10/19/12 1443 Last data filed at 10/19/12 1300  Gross  per 24 hour  Intake    250 ml  Output    250 ml  Net      0 ml     Exam: Followup exam completed. Patient was admitted at 6:25 AM today   Data  Reviewed: Basic Metabolic Panel:  Recent Labs Lab 10/18/12 2200 10/19/12 0800  NA 141  --   K 4.1  --   CL 103  --   CO2 27  --   GLUCOSE 110*  --   BUN 11  --   CREATININE 0.80 0.74  CALCIUM 9.7  --    Liver Function Tests:  Recent Labs Lab 10/18/12 2200  AST 19  ALT 6  ALKPHOS 66  BILITOT 0.2*  PROT 7.2  ALBUMIN 3.9   No results found for this basename: LIPASE, AMYLASE,  in the last 168 hours  Recent Labs Lab 10/18/12 2349  AMMONIA 57   CBC:  Recent Labs Lab 10/18/12 2200 10/19/12 0800  WBC 9.0 15.9*  NEUTROABS 4.4  --   HGB 13.8 12.7*  HCT 39.4 36.1*  MCV 86.2 84.1  PLT 241 242   Cardiac Enzymes: No results found for this basename: CKTOTAL, CKMB, CKMBINDEX, TROPONINI,  in the last 168 hours BNP (last 3 results) No results found for this basename: PROBNP,  in the last 8760 hours CBG: No results found for this basename: GLUCAP,  in the last 168 hours  Recent Results (from the past 240 hour(s))  MRSA PCR SCREENING     Status: None   Collection Time    10/19/12  5:48 AM      Result Value Range Status   MRSA by PCR NEGATIVE  NEGATIVE Final   Comment:            The GeneXpert MRSA Assay (FDA     approved for NASAL specimens     only), is one component of a     comprehensive MRSA colonization     surveillance program. It is not     intended to diagnose MRSA     infection nor to guide or     monitor treatment for     MRSA infections.     Studies:  Recent x-ray studies have been reviewed in detail by the Attending Physician  Scheduled Meds:  Reviewed in detail by the Attending Physician   Sheralyn Boatman, ANP [facility] Office  [phone] Pager [phone]  On-Call/Text Page:      [email or web address]      password [email or web address]  If 7PM-7AM, please contact night-coverage [email or web address] Password [email or web address] 10/19/2012, 2:43 PM   LOS: 1 day   I have examined the patient, reviewed the chart and modified the above note which I agree with.    Brittany Osier,MD [phone] 10/19/2012, 5:14 PM

## 2012-10-19 NOTE — ED Notes (Signed)
Pt's sister is Milas Kocher [phone]

## 2012-10-19 NOTE — ED Notes (Signed)
Restraints continued per md request, + cms check, bathroom offered, pt refused, fluids offered pt refused, pt c/o being hot, pt was fanned with magazine and air temperature adjusted

## 2012-10-19 NOTE — Procedures (Signed)
History: 47 yo M with altered mental state  Background: The background consists of intermixed beta and alpha activities with mild delta activity as well. There is excessive beta activity .There is a well defined posterior dominant rhythm of 9.5 Hz that attenuates with eye opening. Sleep structures are observed with normal distribution.  Photic stimulation: Physiologic driving is not performed  EEG Abnormalities: 1) mild generalized slow activity  Clinical Interpretation: This mildly abnormal EEG is most consistent with a mild generalized non-specific cerebral dysfunction(encephalopathy). There was no seizure or seizure predisposition recorded on this study.   Ritta Slot, MD [facility] [phone]  If 7pm- 7am, please page neurology on call at [phone].

## 2012-10-19 NOTE — Evaluation (Signed)
Received call from ER physician from the med [facility] about patient with acute confusion x1 day. Patient with a baseline history of questionable temporal lobe mass dated back to 2005 that would need followup for questionable neoplasm. Patient has not had followup since this point. Patient was found to be confused at home. Workup thus far has been negative for any type of metabolic derangement or medication induced symptoms. Patient is benzo positive but does take these chronically. Hemodynamically stable. Per the ER physician, case was discussed with the Neuro hospitalist with recommendation for inpatient admission as well as MRI. Patient is currently being given Haldol to help with agitation with hopes to obtain an LP. Agree to admit patient for further workup.

## 2012-10-19 NOTE — Consult Note (Signed)
Reason for Consult: AMS Referring Physician: [place] Blas  CC: Altered Mental Status  History is obtained from:patient, medical record, nursing staff  HPI: Maurice Clay is a 47 y.o. male with a history of temporal lobe T2 change who presents with an episode of confusion. He initially described some headache(has history of headaches) and therefore a migraine cocktail was ordered. Following this, he became increasingly agitated and required restraints.   Per nursing staff, there is a reported history of similar episodes of confusion in the past.  He was given multiple rounds of anti-psychotics in addition ot the benadryl, phenergan and decadron he received last night and multiple rounds of versed.    ROS: A 14 point ROS was performed and is negative except as noted in the HPI.  Past Medical History  Diagnosis Date  . Brain tumor   . Hypertension     Family History: Unable to assess secondary to patient's altered mental status.    Social History: Tob: Unable to assess secondary to patient's altered mental status.    Exam: Current vital signs: BP 125/69  Pulse 74  Temp(Src) 98.5 F (36.9 C) (Oral)  Resp 24  Ht 5\' 10"  (1.778 m)  Wt 58.968 kg (130 lb)  BMI 18.65 kg/m2  SpO2 99% Vital signs in last 24 hours: Temp:  [98.5 F (36.9 C)-98.6 F (37 C)] 98.5 F (36.9 C) (05/15 0521) Pulse Rate:  [73-112] 74 (05/15 0325) Resp:  [16-24] 24 (05/15 0309) BP: (106-125)/(65-87) 125/69 mmHg (05/15 0521) SpO2:  [94 %-100 %] 99 % (05/15 0521) Weight:  [58.968 kg (130 lb)] 58.968 kg (130 lb) (05/14 2150)  General: in bed, in restraints. Thin appearing  CV: RRR Mental Status: Patient is awake, alert, oriented to person,  Year,but not place. When told [name] he was able to tell me [place] was "[place] [address] [place] [address]  He was able to address me by name, but after stepping out for a few minutes, he was unable to tell me my name or where he was.  Cranial Nerves: II: Blinks to threat  bilaterally. Pupils are equal, round, and reactive to light.  Discs are difficult to visualize due to patient cooperation.  III,IV, VI: does not clearly move his eyes laterally in either direction.  V: Facial sensation is symmetric to temperature VII: Facial movement is symmetric.  VIII: hearing is intact to voice X, XI, XII: Unable to assess secondary to patient's altered mental status.  Motor: Tone is normal. Bulk is normal. In restraints and cannot perform formal testing, but no clear weakness.  Sensory: Responds to nox stim x 4.  Deep Tendon Reflexes: 2+ and symmetric in the biceps and patellae.  Cerebellar: Unable to assess secondary to patient's altered mental status.  Gait: Unable to assess secondary to patient's altered mental status.   I have reviewed labs in epic and the results pertinent to this consultation are: Cbc unremarkable Ammonia - 57   I have reviewed the images obtained:CT head - negative  Impression: 47 yo M with AMS in the setting of previous temporal lobe abnormalities and h/o similar episodes. Currently, he continues to be confused, appearing delirious. Differential includes confusional migraine, delirium secondary to metabolic stressor(as yet unidentified) in a person predisposed due to temporal lobe mass, tumor progression, seizure. He also has a history of benzodiazepine use(prescribed) but abuse could explain confusion.   Recommendations: 1) Would check thiamine, then given the low risk of thiamine as intervention, would treat with high dose thiamine, 500mg   TID x 3 days.  2) Check TSH, RPR 3) EEG 4) MRI brain 5) Would consider using bezodiazepine for behavioral control if needed as withdrawal may be a consideration, though UDS did show presence of some.  6) Would try to D/C restraints once safe to do so.    Ritta Slot, MD [facility] [phone]  If 7pm- 7am, please page neurology on call at [phone].

## 2012-10-19 NOTE — ED Notes (Signed)
Pt continues to be agitated and attempting to pull out of restraints. Pt alert but disoriented.

## 2012-10-19 NOTE — H&P (Addendum)
Hospitalist Admission History and Physical  Patient name: Maurice Clay Medical record number: [id number] Date of birth: [birth year] Age: 47 y.o. Gender: male  Primary Care Provider: No primary provider on file.  Chief Complaint: AMS   History of Present Illness:This is a 47 y.o. year old male with significant past medical history of ? Temporal lobe mass and bipolar d/o  presenting with confusion x 1 day. Level V caveat as pt is currently agitated, confused and restrained. Per report, pt was noted by his family to be confused and hallucinating. Pt was brought to ER for further evaluation. Blood work and imaging was generally WNL including Head CT, CBC, ammonia and UDS. Pt was benzo + on UDS, but uses benzos chronically. Per report, case was discussed with neuro-hospitalist with recommendation for admission and MRI.  An LP was going to be attempted, however, became acutely agitated and combative at [facility] ER. Was medically and chemically restrained. Was unable to obtain LP. Pt afebrile on presentation to ER, no leukocytosis. Pt did initially complain of headache. Was given migraine cocktail, including decadron.  Pt with noted to have R temporal lobe mass in 2005 with planned sequential follow up to monitot. However, has not done so.   There are no active problems to display for this patient.  Past Medical History: Past Medical History  Diagnosis Date  . Brain tumor   . Hypertension     Past Surgical History: History reviewed. No pertinent past surgical history.  Social History: History   Social History  . Marital Status: Divorced    Spouse Name: N/A    Number of Children: N/A  . Years of Education: N/A   Social History Main Topics  . Smoking status: Current Every Day Smoker -- 0.50 packs/day    Types: Cigarettes  . Smokeless tobacco: None  . Alcohol Use: No  . Drug Use: No  . Sexually Active: No   Other Topics Concern  . None   Social History Narrative  . None    Family  History: History reviewed. No pertinent family history.  Allergies: Allergies  Allergen Reactions  . Tramadol     Current Facility-Administered Medications  Medication Dose Route Frequency Provider Last Rate Last Dose  . 0.9 %  sodium chloride infusion   Intravenous Continuous Doree Albee, MD      . heparin injection 5,000 Units  5,000 Units Subcutaneous Q8H Doree Albee, MD       Review Of Systems: 12 point ROS negative except as noted above in HPI.  Physical Exam: Filed Vitals:   10/19/12 0521  BP: 125/69  Pulse:   Temp: 98.5 F (36.9 C)  Resp:     General: restrained, agitated, confabulating , s/p haldol, versed, geodon HEENT: PERRLA and poor dentition Heart: S1, S2 normal, no murmur, rub or gallop, regular rate and rhythm Lungs: clear to auscultation, no wheezes or rales and unlabored breathing Abdomen: abdomen is soft without significant tenderness, masses, organomegaly or guarding Extremities: extremities normal, atraumatic, no cyanosis or edema Skin:no rashes Neurology: uncooperative to exam, no focal deficit noted   Labs and Imaging: Lab Results  Component Value Date/Time   NA 141 10/18/2012 10:00 PM   K 4.1 10/18/2012 10:00 PM   CL 103 10/18/2012 10:00 PM   CO2 27 10/18/2012 10:00 PM   BUN 11 10/18/2012 10:00 PM   CREATININE 0.80 10/18/2012 10:00 PM   GLUCOSE 110* 10/18/2012 10:00 PM   Lab Results  Component Value Date   WBC 9.0 10/18/2012  HGB 13.8 10/18/2012   HCT 39.4 10/18/2012   MCV 86.2 10/18/2012   PLT 241 10/18/2012   Drugs of Abuse     Component Value Date/Time   LABOPIA NONE DETECTED 10/18/2012 2339   COCAINSCRNUR NONE DETECTED 10/18/2012 2339   LABBENZ POSITIVE* 10/18/2012 2339   AMPHETMU NONE DETECTED 10/18/2012 2339   THCU NONE DETECTED 10/18/2012 2339   LABBARB NONE DETECTED 10/18/2012 2339    Urinalysis    Component Value Date/Time   COLORURINE YELLOW 10/18/2012 2338   APPEARANCEUR CLEAR 10/18/2012 2338   LABSPEC 1.014 10/18/2012 2338    PHURINE 7.5 10/18/2012 2338   GLUCOSEU NEGATIVE 10/18/2012 2338   HGBUR NEGATIVE 10/18/2012 2338   BILIRUBINUR NEGATIVE 10/18/2012 2338   KETONESUR NEGATIVE 10/18/2012 2338   PROTEINUR NEGATIVE 10/18/2012 2338   UROBILINOGEN 1.0 10/18/2012 2338   NITRITE NEGATIVE 10/18/2012 2338   LEUKOCYTESUR NEGATIVE 10/18/2012 2338       Ct Head Wo Contrast  10/18/2012   *RADIOLOGY REPORT*  Clinical Data: Altered mental status.  Headaches.  CT HEAD WITHOUT CONTRAST  Technique:  Contiguous axial images were obtained from the base of the skull through the vertex without contrast.  Comparison: 02/26/2004 CT.  MRI 03/23/2011  Findings: No acute intracranial abnormality.  Specifically, no hemorrhage, hydrocephalus, mass lesion, acute infarction, or significant intracranial injury.  No acute calvarial abnormality. Mastoid air cells are clear.  Orbital soft tissues unremarkable.  IMPRESSION: No acute intracranial abnormality.   Original Report Authenticated By: Charlett Nose, M.D.   Dg Chest Port 1 View  10/19/2012   *RADIOLOGY REPORT*  Clinical Data: Altered mental status.  PORTABLE CHEST - 1 VIEW  Comparison: 03/23/2011  Findings: Hyperinflation of the lungs.  Areas of scarring in the upper lobes bilaterally.  Diffuse interstitial prominence.  No confluent opacity.  No effusions.  No acute bony abnormality.  IMPRESSION: Stable hyperinflation and chronic changes.  No acute findings.   Original Report Authenticated By: Charlett Nose, M.D.     Assessment and Plan: Maurice Clay is a 47 y.o. year old male presenting with ams  Encephalopathy: Overall work up has been fairly benign thus far. Further progression of ? Temporal lobe mass vs. Psychiatric flare vs. Infection also on DDx. Neuro consult pending. Will obtain MRI brain with and without contrast pending consult. No clinical indications for abx currently. No mengismus, fever, leukocytosis. Follow up pending neuro recs.  FEN/GI: NPO  Prophylaxis: sub q heparin   Disposition: pending further evaluation  Code Status:full code       Doree Albee MD  Pager: [phone]

## 2012-10-19 NOTE — ED Notes (Signed)
Restraints continued, pt offered fluids and bathroom, denied both, family at bedside and updated on poc, + cms check

## 2012-10-19 NOTE — ED Notes (Signed)
At 0010, pt became agitated after receiving benadryl, toradol and phenegran as prescribed by md, pt started having kicking and swinging motion of arms and legs. Pt's sister and brother in law at patients bedside,  Upon arrival to bedside, 2 other RN's attempting to calm pt,

## 2012-10-19 NOTE — ED Notes (Signed)
Called to pt's room by family, stating that he was agitated and pulling at his IV. Dr. Rulon Abide at bedside and ordered benadryl.

## 2012-10-19 NOTE — ED Notes (Signed)
Restraints continued per md request, pt continues to fight staff, screaming and yelling, trying to pull iv's out, + cms check

## 2012-10-19 NOTE — ED Notes (Signed)
Restraints continued, + cms check,

## 2012-10-19 NOTE — Progress Notes (Signed)
EEG completed.

## 2012-10-19 NOTE — ED Notes (Signed)
After alternative attempts to calm patient and keep patient safe, order for 4 point restraints obtained from Dr. Rulon Abide, restraints applied to bilateral ankles and bilateral wrist, family at bedside and aware of use of restraints. Pt continued to attempt to get out of bed, swinging at staff, attempting to pull off medical equipment, pt is not alert and oriented at time and unable to follow directions

## 2012-10-19 NOTE — ED Notes (Signed)
Restraints continued per md request, pt continues to be aggressive toward staff, family at bedside for reassurance, pt oriented to self only, cms check wnl.

## 2012-10-20 ENCOUNTER — Inpatient Hospital Stay (HOSPITAL_COMMUNITY): Payer: MEDICAID

## 2012-10-20 LAB — COMPREHENSIVE METABOLIC PANEL
ALT: 9 U/L (ref 0–53)
Alkaline Phosphatase: 51 U/L (ref 39–117)
CO2: 24 mEq/L (ref 19–32)
Calcium: 8.4 mg/dL (ref 8.4–10.5)
Chloride: 109 mEq/L (ref 96–112)
GFR calc Af Amer: >90 mL/min (ref >90)
GFR calc non Af Amer: >90 mL/min (ref >90)
Glucose, Bld: 105 mg/dL — ABNORMAL HIGH (ref 70–99)
Sodium: 142 mEq/L (ref 135–145)
Total Bilirubin: 0.3 mg/dL (ref 0.3–1.2)

## 2012-10-20 LAB — CBC WITH DIFFERENTIAL/PLATELET
Basophils Absolute: 0 10*3/uL (ref 0.0–0.1)
Basophils Relative: 0 % (ref 0–1)
Eosinophils Absolute: 0.1 10*3/uL (ref 0.0–0.7)
Hemoglobin: 11.8 g/dL — ABNORMAL LOW (ref 13.0–17.0)
MCHC: 34.2 g/dL (ref 30.0–36.0)
Neutro Abs: 7.7 10*3/uL (ref 1.7–7.7)
Neutrophils Relative %: 59 % (ref 43–77)
Platelets: 224 10*3/uL (ref 150–400)
RDW: 14.4 % (ref 11.5–15.5)

## 2012-10-20 MED ORDER — AMITRIPTYLINE HCL 25 MG PO TABS: 25.0000 mg | ORAL_TABLET | Freq: Every day | ORAL | Status: DC

## 2012-10-20 MED ORDER — GADOBENATE DIMEGLUMINE 529 MG/ML IV SOLN
15.0000 mL | Freq: Once | INTRAVENOUS | Status: AC | PRN
  Administered 2012-10-20: 12 mL via INTRAVENOUS

## 2012-10-20 MED ORDER — IBUPROFEN 200 MG PO TABS: 200.0000 mg | ORAL_TABLET | Freq: Four times a day (QID) | ORAL | Status: DC | PRN

## 2012-10-20 MED ORDER — ACETAMINOPHEN 325 MG PO TABS: 650.0000 mg | ORAL_TABLET | Freq: Four times a day (QID) | ORAL | Status: DC | PRN

## 2012-10-20 MED ORDER — AMITRIPTYLINE HCL 25 MG PO TABS
25.0000 mg | ORAL_TABLET | Freq: Every day | ORAL | Status: DC
  Filled 2012-10-20: qty 1

## 2012-10-20 MED ORDER — KETOROLAC TROMETHAMINE 30 MG/ML IJ SOLN
30.0000 mg | Freq: Once | INTRAMUSCULAR | Status: AC
  Administered 2012-10-20: 30 mg via INTRAVENOUS
  Filled 2012-10-20: qty 1

## 2012-10-20 MED ORDER — IBUPROFEN 200 MG PO TABS
200.0000 mg | ORAL_TABLET | Freq: Four times a day (QID) | ORAL | Status: DC | PRN
  Filled 2012-10-20: qty 1

## 2012-10-20 NOTE — Discharge Summary (Addendum)
Physician Discharge Summary  Ceylon Arenson ZOX:[id number] DOB: 30-Jun-1965 DOA: 10/18/2012  PCP: No primary provider on file.  Admit date: 10/18/2012 Discharge date: 10/20/2012  Time spent: 30 minutes  Recommendations for Outpatient Follow-up:  1. Re-establish with Dr. Sandria Manly your previous Neurologist in regards to following up your confusion related to migraine headaches noting you were resumed on amitriptyline this admission.   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 New onset seizure-EEG negative   History of  HTN (hypertension)   Noncompliance with medication treatment due to underuse of medication   Discharge Condition: stable  Diet recommendation: Regular  Filed Weights   10/18/12 2150 10/20/12 0500  Weight: 58.968 kg (130 lb) 59 kg (130 lb 1.1 oz)    History of present illness:  47 year old male patient with documented right temporal lobe lesion seen on MRI in 2005. Presented after being brought in to urgent care by family because of confusion for about 24 hours. When he presented to urgent care he was confused but not aggressive or agitated. At some point while at urgent care the patient complained of headache and was given a migraine cocktail that included Decadron.  An LP was planned after this cocktail, but was unable to be completed because the patient became acutely agitated and combative. He was aggressive enough that he required chemical and physical restraints. Upon presentation [facility] he was found to be afebrile with no leukocytosis.    Hospital Course:   Acute delirium/Agitation requiring sedation protocol/Acute encephalopathy  -Required Geodon, Haldol and Versed in the emergency department   -sister endorsed pt has had similar spells in past after increased personal stressors - had recent personal stress surrounding denial of access to his mother on Mother's Day  -pt did receive steroid prior to agitation so this med likely precipitated AMS AFTER presenationt to urgent care  -alert and appropriate upon exam today  -clarified with patient no prior diagnosis of bipolar disorder  Right temporal lobe mass/DX'D 2005 - ? 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 and recommended resuming previously prescribed amitriptyline  Forehead skin lesion  -pt calls this "skin cancer" but has not been bx'd or diagnosed and has been present x 10 years  -looks like area of repeated trauma with healed hypertrophied tissue border - bx as an outpt would be beneficial  History of HTN  -normotensive OFF meds  Noncompliance with medication treatment due to underuse of medication  -has itinerate work thru sister and no insurance  -Case management consulted - generic Elavil very cheap and he can obtain as $4 script at [employer] or [employer] -likely needs disability so recommend follow up with neuro OP to make determination and complete paperwork   Procedures: EEG: mildly abnormal EEG is most consistent with a mild generalized non-specific cerebral dysfunction(encephalopathy). There was no seizure or seizure predisposition recorded on this study.   Consultations:  Neuro  Discharge Exam: Filed Vitals:   10/20/12 0344 10/20/12 0500 10/20/12  1610 10/20/12 1232  BP: 92/53  97/57   Pulse: 60     Temp: 97.5 F (36.4 C)  98.2 F (36.8 C) 98 F (36.7 C)  TempSrc:  Oral  Oral Oral  Resp: 17     Height:      Weight:  59 kg (130 lb 1.1 oz)    SpO2: 98%      General: No acute respiratory distress Lungs: Clear to auscultation bilaterally without wheezes or crackles, RA Cardiovascular: Regular rate and rhythm without murmur gallop or rub normal S1 and S2, no peripheral edema or JVD Abdomen: Nontender, nondistended, soft, bowel sounds positive, no rebound, no ascites, no appreciable mass Musculoskeletal: No significant cyanosis, clubbing of bilateral lower extremities Neurological: Alert and oriented x 3, moves all extremities x 4 without focal neurological deficits, CN 2-12 intact  Discharge Instructions      Discharge Orders   Future Orders Complete By Expires     Call MD for:  difficulty breathing, headache or visual disturbances  As directed     Call MD for:  persistant dizziness or light-headedness  As directed     Call MD for:  persistant nausea and vomiting  As directed     Call MD for:  severe uncontrolled pain  As directed     Call MD for:  temperature >100.4  As directed     Diet general  As directed     Increase activity slowly  As directed         Medication List    TAKE these medications       acetaminophen 325 MG tablet  Commonly known as:  TYLENOL  Take 2 tablets (650 mg total) by mouth every 6 (six) hours as needed.     amitriptyline 25 MG tablet  Commonly known as:  ELAVIL  Take 1 tablet (25 mg total) by mouth at bedtime.     ibuprofen 200 MG tablet  Commonly known as:  ADVIL,MOTRIN  Take 1 tablet (200 mg total) by mouth every 6 (six) hours as needed for headache.       Allergies  Allergen Reactions  . Tramadol    Follow-up Information   Follow up with Evie Lacks, MD. Schedule an appointment as soon as possible for a visit in 2 weeks. (call to make appointment to be seen in 2 weeks about your confusion migraine syndrome-and to discuss if your brain lesion qualifies you for disability)    Contact information:    [address] [place] [address] [phone]       The results of significant diagnostics from this hospitalization (including imaging, microbiology, ancillary and laboratory) are listed below for reference.    Significant Diagnostic Studies: Ct Head Wo Contrast  10/18/2012   *RADIOLOGY REPORT*  Clinical Data: Altered mental status.  Headaches.  CT HEAD WITHOUT CONTRAST  Technique:  Contiguous axial images were obtained from the base of the skull through the vertex without contrast.  Comparison: 02/26/2004 CT.  MRI 03/23/2011  Findings: No acute intracranial abnormality.  Specifically, no hemorrhage, hydrocephalus, mass lesion, acute infarction, or significant intracranial injury.  No acute calvarial abnormality. Mastoid air cells are clear.  Orbital soft tissues unremarkable.  IMPRESSION: No acute intracranial abnormality.   Original Report Authenticated By: Charlett Nose, M.D.   Mr Laqueta Jean Wo Contrast  10/20/2012   *RADIOLOGY REPORT*  Clinical Data: History of left temporal lesion.  Altered mental status.  Confusion.  Follow-up.  MRI HEAD WITHOUT AND  WITH CONTRAST  Technique:  Multiplanar, multiecho pulse sequences of the brain and surrounding structures were obtained according to standard protocol without and with intravenous contrast  Contrast: 12mL MULTIHANCE GADOBENATE DIMEGLUMINE 529 MG/ML IV SOLN  Comparison: Head CT 05/14.  MRI 03/23/2011.  MRI 12/10/2003.  Findings: Again demonstrated is slight enlargement and increased T2 / FLAIR signal within the mesial temporal lobe anteriorly on the right.  This has not changed since 2005.  Though abnormal, this is consistent with a very low grade or benign process such as hamartoma or very low grade glioma.  No contrast enhancement occurs.  The remainder the brain is normal.  No evidence of infarction, hydrocephalus or extra-axial collection.  No pituitary mass.  No significant sinus disease.  No skull or skull base lesion.  IMPRESSION:  Stable examination since 2005.  Slight enlargement at the anterior mesial temporal lobe on the right with slight increased FLAIR and T2 signal.  No contrast enhancement.  Lack of progression over that time frame argues for a benign process.   Original Report Authenticated By: Paulina Fusi, M.D.   Dg Chest Port 1 View  10/19/2012   *RADIOLOGY REPORT*  Clinical Data: Altered mental status.  PORTABLE CHEST - 1 VIEW  Comparison: 03/23/2011  Findings: Hyperinflation of the lungs.  Areas of scarring in the upper lobes bilaterally.  Diffuse interstitial prominence.  No confluent opacity.  No effusions.  No acute bony abnormality.  IMPRESSION: Stable hyperinflation and chronic changes.  No acute findings.   Original Report Authenticated By: Charlett Nose, M.D.    Microbiology: Recent Results (from the past 240 hour(s))  MRSA PCR SCREENING     Status: None   Collection Time    10/19/12  5:48 AM      Result Value Range Status   MRSA by PCR NEGATIVE  NEGATIVE Final   Comment:            The GeneXpert MRSA Assay (FDA     approved for NASAL specimens     only), is one component of a     comprehensive MRSA colonization     surveillance program. It is not     intended to diagnose MRSA     infection nor to guide or     monitor treatment for     MRSA infections.     Labs: Basic Metabolic Panel:  Recent Labs Lab 10/18/12 2200 10/19/12 0800 10/20/12 0430  NA 141  --  142  K 4.1  --  3.4*  CL 103  --  109  CO2 27  --  24  GLUCOSE 110*  --  105*  BUN 11  --  11  CREATININE 0.80 0.74 0.78  CALCIUM 9.7  --  8.4   Liver Function Tests:  Recent Labs Lab 10/18/12 2200 10/20/12 0430  AST 19 45*  ALT 6 9  ALKPHOS 66 51  BILITOT 0.2* 0.3  PROT 7.2 5.8*  ALBUMIN 3.9 3.2*   Recent Labs Lab 10/18/12 2349  AMMONIA 57   CBC:  Recent Labs Lab 10/18/12 2200 10/19/12 0800 10/20/12 0430  WBC 9.0 15.9* 13.0*  NEUTROABS 4.4  --  7.7  HGB 13.8 12.7* 11.8*  HCT 39.4 36.1* 34.5*  MCV 86.2  84.1 85.8  PLT 241 242 224    Signed:  ELLIS,ALLISON L.ANP  [facility] 10/20/2012, 1:03 PM  I have personally examined this patient and reviewed the entire database. I have reviewed the above note, made any necessary editorial changes,  and agree with its content.  Lonia Blood, MD [facility]

## 2012-10-20 NOTE — Care Management Note (Signed)
    Page 1 of 2   10/20/2012     3:37:11 PM   CARE MANAGEMENT NOTE 10/20/2012  Patient:  Maurice Clay, Maurice Clay   Account Number:  [id number]  Date Initiated:  10/19/2012  Documentation initiated by:  Donn Pierini  Subjective/Objective Assessment:   Pt admitted with AMS- Right temporal lobe mass/DX'D 2005 - ? New onset seizure     Action/Plan:   PTA pt lived at home   Anticipated DC Date:  10/20/2012   Anticipated DC Plan:  HOME/SELF CARE  In-house referral  Financial Counselor      DC Planning Services  CM consult  Indigent Health Clinic  Medication Assistance      Choice offered to / List presented to:             Status of service:  Completed, signed off Medicare Important Message given?   (If response is "NO", the following Medicare IM given date fields will be blank) Date Medicare IM given:   Date Additional Medicare IM given:    Discharge Disposition:  HOME/SELF CARE  Per UR Regulation:  Reviewed for med. necessity/level of care/duration of stay  If discussed at Long Length of Stay Meetings, dates discussed:    Comments:  10/20/12- 1530- Donn Pierini RN, BSN [phone] Pt MRI neg.- to be discharged home today- have not heard back from [facility] clinic- attempted call again without success- informed pt that if this CM hears back from them today will go ahead and back f/u appointment and call pt at home with appointment- pt voices approval of this- if not pt has address and phone # to call for appointment on own. Discharge meds are either over the counter or on [facility] $4 list- pt aware and states able to afford cost.  10/20/12- 1145- Donn Pierini RN, BSN [phone] Pt more alert today-spoke at bedside regarding d/c needs for a PCP in community- per conversation pt states that he does not currently have insurance but does work part time- discussed different clinic options available and pt would like to be set up with [facility] [employer] [facility]- call made  and message left for return call to arrange f/u appointment with clinic. - Pt would be eligible for medication assistance if needed with the [facility] program- it would be helpful to have as many meds as possible be on the $4 list- NCM to continue to follow for any medication assistance needed at discharge.  10/19/12- 1430- Donn Pierini RN, BSN [phone] FC called regarding pt/sister voicing wanting to speak with someone about finances and disability. FC to f/u with pt. NCM to follow for potential medication needs and d/c planning.

## 2012-10-20 NOTE — Progress Notes (Signed)
Subjective: Confusion has resolved.   Upon discussion with the patient, he has had several episodes of confusion like this with a headache preceding it. He was on amitryptiline in the past with good control of his headaches and   Exam: Filed Vitals:   10/20/12 0742  BP: 97/57  Pulse:   Temp: 98.2 F (36.8 C)  Resp:    Gen: In bed, NAD MS: Awake, Alert, oriented and appropriate OZ:HYQMVHQ and tracks, PERRL Motor: moves all extremities well.    Impression: 47 yo M with recurrent episodes of confusion coupled with migraine-like headaches. 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 I would favor restarting this medicine. With the history of temporal lobe changes, I do feel that repeating this imaging would be prudent.   Recommendations: 1) Amitryptiline 25mg  qhs 2) MRI pending 3) Avoid analgesics as rebound headache may be playing a role in his more frequent headaches.   Ritta Slot, MD [facility] [phone]  If 7pm- 7am, please page neurology on call at [phone].

## 2012-10-20 NOTE — Progress Notes (Signed)
Patient has been discharged home today via wheelchair. Iv's have been D/C, reviewed all discharge instructions with the patient, no questions or concerns asked.

## 2012-11-27 ENCOUNTER — Encounter (HOSPITAL_COMMUNITY): Payer: Self-pay | Admitting: *Deleted

## 2012-11-27 ENCOUNTER — Emergency Department (HOSPITAL_COMMUNITY)
Admission: EM | Admit: 2012-11-27 | Discharge: 2012-11-27 | Disposition: A | Discharge status: Home / Self Care | Payer: Self-pay | Attending: Emergency Medicine | Admitting: Emergency Medicine

## 2012-11-27 DIAGNOSIS — D496 Neoplasm of unspecified behavior of brain: Secondary | ICD-10-CM

## 2012-11-27 DIAGNOSIS — Z79899 Other long term (current) drug therapy: Secondary | ICD-10-CM | POA: Insufficient documentation

## 2012-11-27 DIAGNOSIS — F17200 Nicotine dependence, unspecified, uncomplicated: Secondary | ICD-10-CM | POA: Insufficient documentation

## 2012-11-27 DIAGNOSIS — R519 Headache, unspecified: Secondary | ICD-10-CM

## 2012-11-27 DIAGNOSIS — R51 Headache: Secondary | ICD-10-CM | POA: Insufficient documentation

## 2012-11-27 DIAGNOSIS — I10 Essential (primary) hypertension: Secondary | ICD-10-CM | POA: Insufficient documentation

## 2012-11-27 MED ORDER — OXYCODONE-ACETAMINOPHEN 5-325 MG PO TABS: 1.0000 | ORAL_TABLET | ORAL | Status: DC | PRN

## 2012-11-27 NOTE — ED Notes (Signed)
Patient is alert and oriented x3.  He was given DC instructions and follow up visit instructions.  Patient gave verbal understanding.  He was DC ambulatory under his own power to home.  V/S stable.  He was not showing any signs of distress on DC 

## 2012-11-27 NOTE — ED Notes (Addendum)
Patient has been unable to see his PCP or Dr Sandria Manly due to outstanding balances.  He states that he has not been able to pay for his medications.  Patient states that he is wanting to apply to disability due to his pain.  Additionally he states that his anxiety has been increasing recently.

## 2012-11-27 NOTE — ED Provider Notes (Signed)
History    This chart was scribed for non-physician practitioner, Lucretia Kern, working with Derwood Kaplan, MD by Donne Anon, ED Scribe. This patient was seen in room WTR9/WTR9 and the patient's care was started at 1934.  CSN: [id number] Arrival date & time 11/27/12  4098  First MD Initiated Contact with Patient 11/27/12 1934     Chief Complaint  Patient presents with  . Headache    The history is provided by the patient and the EMS personnel. No language interpreter was used.   HPI Comments: Maurice Clay is a 47 y.o. male brought in by ambulance, who presents to the Emergency Department complaining of a HA. Pt states he has an inoperable brain tumor and controls the pain with Percocet.  Per EMS, pt was ambulatory and waiting for the ambulance. Per triage note and pt, he has been unable to see his PCP or Dr. Sandria Manly due to outstanding balances. He has not been unable to pay for his medications and wants to apply for disability due to his pain. He also reports that his anxiety has been increased recently. He is currently requesting pain medication. He has tried Administrator, sports with little relief.   Past Medical History  Diagnosis Date  . Brain tumor   . Hypertension    History reviewed. No pertinent past surgical history. History reviewed. No pertinent family history. History  Substance Use Topics  . Smoking status: Current Every Day Smoker -- 0.50 packs/day    Types: Cigarettes  . Smokeless tobacco: Not on file  . Alcohol Use: No    Review of Systems  Neurological: Positive for headaches.  Psychiatric/Behavioral: The patient is nervous/anxious.   All other systems reviewed and are negative.    Allergies  Tramadol  Home Medications   Current Outpatient Rx  Name  Route  Sig  Dispense  Refill  . acetaminophen (TYLENOL) 325 MG tablet   Oral   Take 2 tablets (650 mg total) by mouth every 6 (six) hours as needed.         Marland Kitchen amitriptyline (ELAVIL) 25 MG tablet    Oral   Take 1 tablet (25 mg total) by mouth at bedtime.   30 tablet   11   . ibuprofen (ADVIL,MOTRIN) 200 MG tablet   Oral   Take 1 tablet (200 mg total) by mouth every 6 (six) hours as needed for headache.   30 tablet   0    There were no vitals taken for this visit. Physical Exam  Nursing note and vitals reviewed. Constitutional: He is oriented to person, place, and time. He appears well-developed and well-nourished. No distress.  HENT:  Head: Normocephalic and atraumatic.  Right Ear: Tympanic membrane and external ear normal.  Left Ear: Tympanic membrane and external ear normal.  Mouth/Throat: Oropharynx is clear and moist. No oropharyngeal exudate.  Eyes: Conjunctivae and EOM are normal. Pupils are equal, round, and reactive to light.  Neck: Normal range of motion. Neck supple. No tracheal deviation present.  Cardiovascular: Normal rate.   Pulmonary/Chest: Effort normal. No respiratory distress.  Musculoskeletal: Normal range of motion.  Equal grip strength bilaterally. Normal coordination.   Neurological: He is alert and oriented to person, place, and time. No cranial nerve deficit.  5/5 and equal upper and lower extremity strength bilaterally. Equal grip strength bilaterally. Normal finger to nose and heel to shin. No pronator drift. Gait normal  Skin: Skin is warm and dry.  Psychiatric: He has a normal mood and affect.  His behavior is normal.    ED Course  Procedures (including critical care time) DIAGNOSTIC STUDIES: None performed.   COORDINATION OF CARE: 9:32 PM Discussed treatment plan which includes pain medication with pt at bedside and pt agreed to plan. Advised pt to use resource guide to find PCP and pain clinic.    1. Headache   2. Brain tumor     MDM  Pt with inoperable tumor, frequent headaches, hx of chronic narcotic dependance, stats currently not taking anything. MR from last month reviewed, tumor is unchanged in size. Normal neuro exam. Pt asking  for percocet, to treat his pain. Will give 15tabs, explained will need to follow up with PCP to receive more pain medications.    I personally performed the services described in this documentation, which was scribed in my presence. The recorded information has been reviewed and is accurate.    Lottie Mussel, PA-C 11/28/12 0130  Lottie Mussel, PA-C 11/28/12 0131

## 2012-11-27 NOTE — ED Notes (Signed)
EMS called to home.  Found patient sitting on porch waiting for ambulance. Patient is ambulatory.  States that he has a headache with anxiety.  Patient Does have a history of inoperable brain tumor.  V/S stable.  Current pain level Is 8.5 per patient

## 2012-11-28 NOTE — ED Provider Notes (Signed)
Medical screening examination/treatment/procedure(s) were performed by non-physician practitioner and as supervising physician I was immediately available for consultation/collaboration.  Cheyane Ayon, MD 11/28/12 1439 

## 2012-11-30 ENCOUNTER — Ambulatory Visit: Payer: Self-pay | Attending: Family Medicine | Admitting: Internal Medicine

## 2012-11-30 VITALS — BP 110/77 | HR 78 | Temp 98.2°F | Resp 16 | Ht 70.0 in | Wt 129.6 lb

## 2012-11-30 DIAGNOSIS — F17200 Nicotine dependence, unspecified, uncomplicated: Secondary | ICD-10-CM

## 2012-11-30 DIAGNOSIS — D496 Neoplasm of unspecified behavior of brain: Secondary | ICD-10-CM

## 2012-11-30 DIAGNOSIS — Z720 Tobacco use: Secondary | ICD-10-CM

## 2012-11-30 DIAGNOSIS — R51 Headache: Secondary | ICD-10-CM | POA: Insufficient documentation

## 2012-11-30 DIAGNOSIS — F411 Generalized anxiety disorder: Secondary | ICD-10-CM | POA: Insufficient documentation

## 2012-11-30 MED ORDER — CLONAZEPAM 0.5 MG PO TABS: 0.5000 mg | ORAL_TABLET | Freq: Two times a day (BID) | ORAL | Status: DC | PRN

## 2012-11-30 MED ORDER — OXYCODONE-ACETAMINOPHEN 5-325 MG PO TABS: 1.0000 | ORAL_TABLET | ORAL | Status: DC | PRN

## 2012-11-30 NOTE — Progress Notes (Signed)
Patient ID: Maurice Clay, male   DOB: 03/31/1966, 47 y.o.   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  He was hospitalized 10/18/2012-10/20/2012 for assessment and evaluation of acute delirium and agitation necessitating the use of the sedation protocol with Geodon, Haldol, and Versed. He was evaluated by neurology during his hospital stay and an EEG was done at that time, which was consistent with encephalopathy without acute seizure. An MRI of his brain was repeated which showed stability of the temporal lobe lesion on the right. He was instructed to followup with Dr. Sandria Manly of neurology 2 weeks post discharge, but was unable to keep his appointment secondary to having an unpaid balance. Patient was reevaluated in the emergency department on 11/27/2012 for complaints of headache. He has taken Goody powders with no relief. He was given a prescription for 15 tablets of Percocet for his headache and told to followup with PCP for ongoing treatment. Patient states that he has had constant headaches associated with flashing lights that interfere with his ability to sleep. Headache is worse with loud noises. He has associated dizziness. He feels helpless and depressed and anxious. He describes multiple psychosocial stressors. He tells me he has taken as many as 180 Percocets in one week in the past for his headaches.  Allergies  Allergen Reactions  . Darvocet (Propoxyphene-Acetaminophen)   . Tramadol    Past Medical History  Diagnosis Date  . Brain tumor   . Hypertension    Current Outpatient Prescriptions on File Prior to Visit  Medication Sig Dispense Refill  . Aspirin-Acetaminophen-Caffeine (GOODY HEADACHE PO) Take 4-5 packets by mouth every 6 (six) hours as needed (for pain).      Marland Kitchen oxyCODONE-acetaminophen (PERCOCET) 5-325 MG per tablet Take 1  tablet by mouth every 4 (four) hours as needed for pain.  15 tablet  0   No current facility-administered medications on file prior to visit.   No family history on file. History   Social History  . Marital Status: Divorced    Spouse Name: N/A    Number of Children: N/A  . Years of Education: N/A   Occupational History  . Not on file.   Social History Main Topics  . Smoking status: Current Every Day Smoker -- 0.50 packs/day    Types: Cigarettes  . Smokeless tobacco: Not on file  . Alcohol Use: No  . Drug Use: No  . Sexually Active: No   Other Topics Concern  . Not on file   Social History Narrative  . No narrative on file    Review of Systems: Constitutional: No fever or chills;  Appetite normal; No weight loss.  HEENT: No blurry vision or diplopia, no pharyngitis or dysphagia CV: No chest pain or arrhythmia.  Resp: No SOB, no cough. GI: No N/V, no diarrhea, no melena or hematochezia.  GU: No dysuria or hematuria.  MSK: no myalgias/arthralgias.  Neuro:  + headache or focal neurological deficits.  Psych: + depression and anxiety.  Endo: No thyroid disease or DM.  Skin: No rashes or lesions.  Heme: No anemia or blood dyscrasia   Objective:   Filed Vitals:   11/30/12 1702  BP: 110/77  Pulse: 78  Temp: 98.2 F (36.8 C)  Resp: 16    Physical Exam  Constitutional: Appears well-developed and well-nourished. No distress.  HENT: Normocephalic. External right and left ear normal. Oropharynx is clear and moist. Poor dentition. Eyes: Conjunctivae and EOM are normal. PERRLA, no scleral icterus.  Neck: Normal ROM. Neck supple. No JVD. No tracheal deviation. No thyromegaly.  CVS: RRR, S1/S2 +, no murmurs, no gallops, no carotid bruit.  Pulmonary: Effort and breath sounds normal, no stridor, rhonchi, wheezes, rales.  Abdominal: Soft. BS +,  no distension, tenderness, rebound or guarding. Musculoskeletal: Normal range of motion. No edema and no tenderness.  Neuro: Alert. Normal  reflexes, muscle tone coordination. No cranial nerve deficit. Skin: Skin is warm and dry. No rash noted. Not diaphoretic. No erythema. No pallor.  Psychiatric: Anxious/depressed mood and affect. Behavior, judgment, thought content normal.   Lab Results  Component Value Date   WBC 13.0* 10/20/2012   HGB 11.8* 10/20/2012   HCT 34.5* 10/20/2012   MCV 85.8 10/20/2012   PLT 224 10/20/2012   Lab Results  Component Value Date   CREATININE 0.78 10/20/2012   BUN 11 10/20/2012   NA 142 10/20/2012   K 3.4* 10/20/2012   CL 109 10/20/2012   CO2 24 10/20/2012    No results found for this basename: HGBA1C   Lipid Panel  No results found for this basename: chol, trig, hdl, cholhdl, vldl, ldlcalc       Assessment and plan:  1. Anxiety: Patient tells me he's taken 1 mg of Klonopin twice a day in the past for good anxiety control. He's been counseled that he needs to be under the care of a psychiatrist for assessment of his anxiety and depression and that Klonopin is habit-forming and will not be prescribed chronically for him without the input of a psychiatrist. He was given a prescription for Klonopin 0.5 mg #30. 2. 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 His most recent MRI appears to show stability of his brain lesion, thought to represent a benign process. Recommend he followup with a neurologist as soon as he is able to get his bill paid. Have advised him that he needs to be under the management of the pain clinic Dr. and that this clinic is not equipped to manage chronic pain issues. His he was given a prescription for #30 Percocet and advised that this cannot be routinely refilled through this clinic. A pain management referral has been requested. 3. Tobacco abuse: Patient was counseled regarding importance of cessation.   Return to the clinic: As needed.  Signed:  Dr. Trula Ore Jerman Tinnon 11/30/2012 5:05 PM

## 2012-11-30 NOTE — Patient Instructions (Signed)

## 2012-11-30 NOTE — Progress Notes (Signed)
Pt here establish care and c/o chronic anxiety and pain issues due to brain tumor.

## 2012-12-29 ENCOUNTER — Telehealth: Payer: Self-pay | Admitting: Family Medicine

## 2012-12-29 NOTE — Telephone Encounter (Signed)
Pt is experiencing anxiety attacks and would like to have script refilled because he has ran out of medicine; pt last saw Dr. Darnelle Catalan

## 2012-12-29 NOTE — Telephone Encounter (Signed)
Pt was counseled during his last office visit with Dr. Darnelle Catalan that he needs to be under the care of a psychiatrist for assessment of his anxiety and depression and that Klonopin is habit-forming and will not be prescribed chronically for him without the input of a psychiatrist.  We will not refill any controlled substance without an office visit.  He needs to come in for an office visit and needs to see a psychiatrist.  Also, please refer him to call behavioral health or [facility] and be seen by them as a walk-in for further care. Also, if he is having withdrawal symptoms then he needs to go to ER for further evaluation and treatment.

## 2013-01-02 ENCOUNTER — Telehealth: Payer: Self-pay | Admitting: *Deleted

## 2013-01-02 NOTE — Telephone Encounter (Signed)
01/02/13 Patient stated that he has an appointment for tomorrow 01/03/13 for follow up. P.Merridy Pascoe,RN BSN [facility]

## 2013-01-03 ENCOUNTER — Ambulatory Visit: Payer: Self-pay | Attending: Family Medicine | Admitting: Internal Medicine

## 2013-01-03 VITALS — BP 119/80 | HR 75 | Temp 98.4°F | Resp 18 | Wt 135.2 lb

## 2013-01-03 DIAGNOSIS — R220 Localized swelling, mass and lump, head: Secondary | ICD-10-CM

## 2013-01-03 DIAGNOSIS — R51 Headache: Secondary | ICD-10-CM | POA: Insufficient documentation

## 2013-01-03 DIAGNOSIS — F411 Generalized anxiety disorder: Secondary | ICD-10-CM | POA: Insufficient documentation

## 2013-01-03 DIAGNOSIS — R900 Intracranial space-occupying lesion found on diagnostic imaging of central nervous system: Secondary | ICD-10-CM

## 2013-01-03 MED ORDER — OXYCODONE-ACETAMINOPHEN 5-325 MG PO TABS: 1.0000 | ORAL_TABLET | ORAL | Status: DC | PRN

## 2013-01-03 MED ORDER — CLONAZEPAM 0.5 MG PO TABS: 0.5000 mg | ORAL_TABLET | Freq: Two times a day (BID) | ORAL | Status: DC | PRN

## 2013-01-03 NOTE — Progress Notes (Signed)
Patient ID: Maurice Clay, male   DOB: July 23, 1965, 47 y.o.   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 He was hospitalized 10/18/2012-10/20/2012 for assessment and evaluation of acute delirium and agitation necessitating the use of the sedation protocol with Geodon, Haldol, and Versed. He was evaluated by neurology during his hospital stay and an EEG was done at that time, which was consistent with encephalopathy without acute seizure. An MRI of his brain was repeated which showed stability of the temporal lobe lesion on the right. He was instructed to followup with Dr. Sandria Manly of neurology 2 weeks post discharge, but was unable to keep his appointment secondary to having an unpaid balance. Patient was reevaluated in the emergency department on 11/27/2012 for complaints of headache. He has taken Goody powders with no relief. He was given a prescription for 15 tablets of Percocet for his headache and told to followup with PCP for ongoing treatment. Patient states that he has had constant headaches associated with flashing lights that interfere with his ability to sleep. Headache is worse with loud noises. He has associated dizziness. He feels helpless and depressed and anxious. He describes multiple psychosocial stressors.   The patient is in the process of getting his orange card He's also the process of applying for disability The patient has not driven for the last 2 months. He still continues to work He denies any new symptoms other than photophobia, chronic headaches, and occasional numbness and a negative left hand  He has not yet received a referral for pain clinic because of insurance   Allergies  Allergen Reactions  . Darvocet (Propoxyphene-Acetaminophen)   . Tramadol    Past Medical History  Diagnosis Date  . Brain tumor   . Hypertension     Current Outpatient Prescriptions on File Prior to Visit  Medication Sig Dispense Refill  . Aspirin-Acetaminophen-Caffeine (GOODY HEADACHE PO) Take 4-5 packets by mouth every 6 (six) hours as needed (for pain).       No current facility-administered medications on file prior to visit.   No family history on file. History   Social History  . Marital Status: Divorced    Spouse Name: N/A    Number of Children: N/A  . Years of Education: N/A   Occupational History  . Not on file.   Social History Main Topics  . Smoking status: Current Every Day Smoker -- 0.50 packs/day    Types: Cigarettes  . Smokeless tobacco: Not on file  . Alcohol Use: No  . Drug Use: No  . Sexually Active: No   Other Topics Concern  . Not on file   Social History Narrative  . No narrative on file    Review of Systems  Constitutional: Negative for fever, chills, diaphoresis, activity change, appetite change and fatigue.  HENT: Negative for ear pain, nosebleeds, congestion, facial swelling, rhinorrhea, neck pain, neck stiffness and ear discharge.   Eyes: Negative for pain, discharge, redness, itching and visual disturbance.  Respiratory: Negative for cough, choking, chest tightness, shortness of breath, wheezing and stridor.   Cardiovascular: Negative for chest pain, palpitations and leg swelling.  Gastrointestinal: Negative for abdominal distention.  Genitourinary: Negative for dysuria, urgency, frequency, hematuria, flank pain, decreased urine volume, difficulty urinating and dyspareunia.  Musculoskeletal: Negative for back pain, joint swelling, arthralgias and gait problem.  Neurological: Negative for dizziness, tremors, seizures,  syncope, facial asymmetry, speech difficulty, weakness, light-headedness, numbness and headaches.  Hematological: Negative for adenopathy. Does not bruise/bleed easily.  Psychiatric/Behavioral: Negative for hallucinations, behavioral problems, confusion, dysphoric mood,  decreased concentration and agitation.    Objective:   Filed Vitals:   01/03/13 1325  BP: 119/80  Pulse: 75  Temp: 98.4 F (36.9 C)  Resp: 18    Physical Exam  Constitutional: Appears well-developed and well-nourished. No distress.  HENT: Normocephalic. External right and left ear normal. Oropharynx is clear and moist.  Eyes: Conjunctivae and EOM are normal. PERRLA, no scleral icterus.  Neck: Normal ROM. Neck supple. No JVD. No tracheal deviation. No thyromegaly.  CVS: RRR, S1/S2 +, no murmurs, no gallops, no carotid bruit.  Pulmonary: Effort and breath sounds normal, no stridor, rhonchi, wheezes, rales.  Abdominal: Soft. BS +,  no distension, tenderness, rebound or guarding.  Musculoskeletal: Normal range of motion. No edema and no tenderness.  Lymphadenopathy: No lymphadenopathy noted, cervical, inguinal. Neuro: Alert. Normal reflexes, muscle tone coordination. No cranial nerve deficit. Skin: Skin is warm and dry. No rash noted. Not diaphoretic. No erythema. No pallor.  Psychiatric: Normal mood and affect. Behavior, judgment, thought content normal.   Lab Results  Component Value Date   WBC 13.0* 10/20/2012   HGB 11.8* 10/20/2012   HCT 34.5* 10/20/2012   MCV 85.8 10/20/2012   PLT 224 10/20/2012   Lab Results  Component Value Date   CREATININE 0.78 10/20/2012   BUN 11 10/20/2012   NA 142 10/20/2012   K 3.4* 10/20/2012   CL 109 10/20/2012   CO2 24 10/20/2012    No results found for this basename: HGBA1C   Lipid Panel  No results found for this basename: chol, trig, hdl, cholhdl, vldl, ldlcalc       Assessment and plan:   Patient Active Problem List   Diagnosis Date Noted  . Dehydration 10/19/2012  . Leukocytosis 10/19/2012  . Right temporal lobe mass/DX'D 2005 10/19/2012  . Acute delirium 10/19/2012  . Agitation requiring sedation protocol 10/19/2012  . ? New onset seizure 10/19/2012  . Acute encephalopathy 10/19/2012  . History of  HTN (hypertension) 10/19/2012   . Noncompliance with medication treatment due to underuse of medication 10/19/2012       Anxiety Will renew Klonopin at 0.5 mg by mouth twice a day for 30 tablets  Chronic headaches Refill Percocet for 30 tablets Patient is advised not to take more than one or 2 tablets a day The new pain clinic referral has been urgent   Followup in one month

## 2013-01-03 NOTE — Progress Notes (Signed)
Patient here to follow up Has inoperable brain tumor

## 2013-01-10 ENCOUNTER — Ambulatory Visit: Payer: Self-pay

## 2013-02-02 ENCOUNTER — Ambulatory Visit: Payer: Self-pay

## 2013-06-25 ENCOUNTER — Encounter (HOSPITAL_COMMUNITY): Payer: Self-pay | Admitting: Emergency Medicine

## 2013-06-25 ENCOUNTER — Emergency Department (INDEPENDENT_AMBULATORY_CARE_PROVIDER_SITE_OTHER)
Admission: EM | Admit: 2013-06-25 | Discharge: 2013-06-25 | Disposition: A | Discharge status: Home / Self Care | Payer: Self-pay | Source: Home / Self Care | Attending: Family Medicine | Admitting: Family Medicine

## 2013-06-25 ENCOUNTER — Emergency Department (HOSPITAL_COMMUNITY)
Admission: EM | Admit: 2013-06-25 | Discharge: 2013-06-25 | Disposition: A | Discharge status: Home / Self Care | Payer: Self-pay | Attending: Emergency Medicine | Admitting: Emergency Medicine

## 2013-06-25 DIAGNOSIS — K047 Periapical abscess without sinus: Secondary | ICD-10-CM | POA: Insufficient documentation

## 2013-06-25 DIAGNOSIS — D496 Neoplasm of unspecified behavior of brain: Secondary | ICD-10-CM | POA: Insufficient documentation

## 2013-06-25 DIAGNOSIS — G894 Chronic pain syndrome: Secondary | ICD-10-CM

## 2013-06-25 DIAGNOSIS — K029 Dental caries, unspecified: Secondary | ICD-10-CM | POA: Insufficient documentation

## 2013-06-25 DIAGNOSIS — F17200 Nicotine dependence, unspecified, uncomplicated: Secondary | ICD-10-CM | POA: Insufficient documentation

## 2013-06-25 DIAGNOSIS — F411 Generalized anxiety disorder: Secondary | ICD-10-CM | POA: Insufficient documentation

## 2013-06-25 HISTORY — DX: Anxiety disorder, unspecified: F41.9

## 2013-06-25 MED ORDER — PENICILLIN V POTASSIUM 500 MG PO TABS: 500.0000 mg | ORAL_TABLET | Freq: Four times a day (QID) | ORAL | Status: AC

## 2013-06-25 MED ORDER — HYDROCODONE-ACETAMINOPHEN 5-325 MG PO TABS: 1.0000 | ORAL_TABLET | ORAL | Status: DC | PRN

## 2013-06-25 NOTE — ED Notes (Signed)
Pt reports he is here to have his Percocet refilled and Klonopin  Reports he has an "inoperable brain tumor".... Constant HA Pt failed to notify me that he was at [facility] ER this am and was given Hydrocodone.  He is alert w/no signs of acute distress.

## 2013-06-25 NOTE — ED Provider Notes (Signed)
CSN: [id number]     Arrival date & time 06/25/13  1108 History   First MD Initiated Contact with Patient 06/25/13 1124     Chief Complaint  Patient presents with  . Dental Pain   (Consider location/radiation/quality/duration/timing/severity/associated sxs/prior Treatment) The history is provided by the patient and medical records.   This is a 48 year old male with past medical history significant for hypertension, anxiety, inoperable brain tumor, presenting to the ED for dental abscess.  Patient states he has had rotten teeth for many years.  States after the discovery of his brain tumor he has lost all insurance and does not have money for healthcare or dental work.  States over the past several days left lower jaw has become swollen and painful.  States he has been taking a sewing needle and poking his gums with expression of purulent material in various places throughout his mouth.  Has hx of prior dental abscesses with similar sx.  Denies fevers or chills at home.  Sx worse with chewing on affected side.  No difficulty swallowing, no trismus.  VS stable on arrival.  Past Medical History  Diagnosis Date  . Brain tumor   . Hypertension   . Anxiety    History reviewed. No pertinent past surgical history. No family history on file. History  Substance Use Topics  . Smoking status: Current Every Day Smoker -- 0.50 packs/day    Types: Cigarettes  . Smokeless tobacco: Not on file  . Alcohol Use: No    Review of Systems  HENT: Positive for dental problem.   All other systems reviewed and are negative.    Allergies  Darvocet and Tramadol  Home Medications   Current Outpatient Rx  Name  Route  Sig  Dispense  Refill  . Aspirin-Acetaminophen-Caffeine (GOODY HEADACHE PO)   Oral   Take 4-5 packets by mouth every 6 (six) hours as needed (for pain).         . clonazePAM (KLONOPIN) 0.5 MG tablet   Oral   Take 1 tablet (0.5 mg total) by mouth 2 (two) times daily as needed for  anxiety.   30 tablet   0   . oxyCODONE-acetaminophen (PERCOCET) 5-325 MG per tablet   Oral   Take 1 tablet by mouth every 4 (four) hours as needed for pain.   30 tablet   0    BP 114/65  Pulse 104  Temp(Src) 98.7 F (37.1 C) (Oral)  Resp 16  Ht 5\' 10"  (1.778 m)  Wt 133 lb (60.328 kg)  BMI 19.08 kg/m2  SpO2 99%  Physical Exam  Nursing note and vitals reviewed. Constitutional: He is oriented to person, place, and time. He appears well-developed and well-nourished. No distress.  HENT:  Head: Normocephalic and atraumatic.  Mouth/Throat: Uvula is midline, oropharynx is clear and moist and mucous membranes are normal. No trismus in the jaw. Abnormal dentition. Dental abscesses and dental caries present. No uvula swelling. No oropharyngeal exudate, posterior oropharyngeal edema, posterior oropharyngeal erythema or tonsillar abscesses.  Teeth largely in poor dentition, left lower molars flush with gumline with large cavities present, surrounding gingiva swollen and erythematous with multiple small dental abscesses present, handling secretions appropriately, no difficulty swallowing, no trismus  Eyes: Conjunctivae and EOM are normal. Pupils are equal, round, and reactive to light.  Neck: Normal range of motion. Neck supple.  Cardiovascular: Normal rate, regular rhythm and normal heart sounds.   Pulmonary/Chest: Effort normal and breath sounds normal. No respiratory distress. He has no wheezes.  Musculoskeletal: Normal range of motion.  Neurological: He is alert and oriented to person, place, and time.  Skin: Skin is warm and dry. He is not diaphoretic.  Psychiatric: He has a normal mood and affect.    ED Course  Procedures (including critical care time) Labs Review Labs Reviewed - No data to display Imaging Review No results found.  EKG Interpretation   None       MDM   1. Dental abscess    Dental pain with sites of multiple dental abscesses. Patient is afebrile,  handling secretions appropriately, no trismus.  Will be started on penicillin and vicodin.  Instructed to FU with dentist as soon as possible, referral given.  Discussed plan with pt, he agreed.  Return precautions advised.  Larene Pickett, PA-C 06/25/13 1221

## 2013-06-25 NOTE — Discharge Instructions (Signed)
Return as needed

## 2013-06-25 NOTE — ED Notes (Signed)
Pt left w/o d/c instructions... No happy that he did not get percocet that he was asking for.

## 2013-06-25 NOTE — ED Notes (Signed)
Pt her for dental pain and swelling. States he has an "inoperable brain tumor" and takes Percocet (3) daily for chronic headache. States has not been to his doctor in 2-3 months. Swelling noted to left lower jaw.

## 2013-06-25 NOTE — Discharge Instructions (Signed)
Take the prescribed medication as directed. °Follow-up with dentist as soon as possible. °Return to the ED for new or worsening symptoms. °

## 2013-06-25 NOTE — ED Notes (Signed)
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  He has been taking a needle and getting pus out of other places in his mouth, but he has not tried that with his L lower teeth.  L jaw swollen.

## 2013-06-25 NOTE — ED Provider Notes (Signed)
CSN: [id number]     Arrival date & time 06/25/13  1232 History   First MD Initiated Contact with Patient 06/25/13 1427     Chief Complaint  Patient presents with  . Medication Refill   (Consider location/radiation/quality/duration/timing/severity/associated sxs/prior Treatment) Patient is a 48 y.o. male presenting with general illness. The history is provided by the patient.  Illness Location:  Was in ER this am with dental pain, reports that he didn't fill vicodin , then states that he has 90 dilaudid at home , I don't need anything  Quality:  Pt in nad, left without further discussion. Chronicity:  Chronic   Past Medical History  Diagnosis Date  . Brain tumor   . Hypertension   . Anxiety    History reviewed. No pertinent past surgical history. No family history on file. History  Substance Use Topics  . Smoking status: Current Every Day Smoker -- 0.50 packs/day    Types: Cigarettes  . Smokeless tobacco: Not on file  . Alcohol Use: No    Review of Systems  Allergies  Darvocet and Tramadol  Home Medications   Current Outpatient Rx  Name  Route  Sig  Dispense  Refill  . Aspirin-Acetaminophen-Caffeine (GOODY HEADACHE PO)   Oral   Take 4-5 packets by mouth every 6 (six) hours as needed (for pain).         . clonazePAM (KLONOPIN) 0.5 MG tablet   Oral   Take 1 tablet (0.5 mg total) by mouth 2 (two) times daily as needed for anxiety.   30 tablet   0   . HYDROcodone-acetaminophen (NORCO/VICODIN) 5-325 MG per tablet   Oral   Take 1 tablet by mouth every 4 (four) hours as needed.   10 tablet   0   . oxyCODONE-acetaminophen (PERCOCET) 5-325 MG per tablet   Oral   Take 1 tablet by mouth every 4 (four) hours as needed for pain.   30 tablet   0   . penicillin v potassium (VEETID) 500 MG tablet   Oral   Take 1 tablet (500 mg total) by mouth 4 (four) times daily.   40 tablet   0    BP 132/84  Pulse 92  Temp(Src) 100 F (37.8 C) (Oral)  Resp 18  SpO2  98% Physical Exam  Nursing note and vitals reviewed. Constitutional: He is oriented to person, place, and time. No distress.  Neurological: He is alert and oriented to person, place, and time.    ED Course  Procedures (including critical care time) Labs Review Labs Reviewed - No data to display Imaging Review No results found.  EKG Interpretation    Date/Time:    Ventricular Rate:    PR Interval:    QRS Duration:   QT Interval:    QTC Calculation:   R Axis:     Text Interpretation:              MDM      Billy Fischer, MD 06/25/13 1506

## 2013-06-26 NOTE — ED Provider Notes (Signed)
Medical screening examination/treatment/procedure(s) were performed by non-physician practitioner and as supervising physician I was immediately available for consultation/collaboration.  EKG Interpretation   None         Blanchard Kelch, MD 06/26/13 1027

## 2013-07-31 ENCOUNTER — Ambulatory Visit: Payer: Self-pay | Admitting: Internal Medicine

## 2013-08-02 ENCOUNTER — Ambulatory Visit: Payer: Self-pay | Admitting: Internal Medicine

## 2013-08-09 ENCOUNTER — Encounter: Payer: Self-pay | Admitting: Family Medicine

## 2013-08-09 ENCOUNTER — Ambulatory Visit: Payer: Self-pay | Attending: Internal Medicine | Admitting: Family Medicine

## 2013-08-09 VITALS — BP 113/82 | HR 68 | Temp 98.9°F | Resp 14 | Ht 70.0 in | Wt 135.2 lb

## 2013-08-09 DIAGNOSIS — Z760 Encounter for issue of repeat prescription: Secondary | ICD-10-CM

## 2013-08-09 DIAGNOSIS — G8929 Other chronic pain: Secondary | ICD-10-CM

## 2013-08-09 DIAGNOSIS — R51 Headache: Secondary | ICD-10-CM | POA: Insufficient documentation

## 2013-08-09 DIAGNOSIS — F411 Generalized anxiety disorder: Secondary | ICD-10-CM | POA: Insufficient documentation

## 2013-08-09 NOTE — Progress Notes (Signed)
Patient ID: Maurice Clay, male   DOB: [birth year], 48 y.o.   MRN: [id number]  Pt here requesting refills for percocet and klonopin. He feels both doses need to be significantly increased as he is still experiencing pain and anxiety. He continues to be under great stress and continues to have headaches, presumably resulting from a brain tumor. He has been referred to neuro, has seen neuro in the hospital last year, and has been referred to behavioral health. He has been told multiple times that we in this clinic are happy to manage his health maintanance and provide primary care but we cannot and will not manage long term narcotics and/or benzos. The patient has neither followed up with neuro or BH. He has not actually seen [place] in this clinic since 7/14 (7 mos ago). I discussed with him again our controlled substance policy and offered to re-refer him to BH/neuro. He declines referrals, stating he "just really needs meds!!!". As he has not been prescribed benzos or narcotics in such a long time, I do not think he is at risk of withdrawal. I have told him that if he does have w/d sx (if getting the drugs from non prescription sources) we can refer him to places to detox or he can go to the ED. He declines these resources. He declined physical exam today and also declined to discuss any topic other than "getting the meds!!!".  I have not refilled either percocet or klonopin. I have emphasized following up as appropriate and going to the ED for acute sx.

## 2013-08-09 NOTE — Patient Instructions (Signed)
We understand that you have pain and anxiety, however you have been told multiple times that our clinic does not and will not provide long term narcotics or benzodiazepines. You have also been shown our controlled substance policy.  The last time you were seen in our office was 01/03/2013.  We have not prescribed any medication for you since that time. 06/25/2013 (about 6 weeks ago) you were seen in the Emergency department for dental pain and prescribed Vicodin.  Later that day you returned and told an ED physician that you did not fill that Vicodin because you had 90 tablets of dilaudid at home.  10/20/2012 You were discharged from the hospital and told to follow up with your neurologist for your headaches.  11/30/2012 You were seen in our clinic and told that the percocet prescription you were being given that day was your last from here. You were also told again to follow up with your neurologist.  01/03/2013 you were prescribed klonopin and percocet as a courtesy while you were presumably trying to get in with your specialists.   As we have said before, we can not and WILL NOT prescribe your narcotics and benzos here without imput from your psychiatrist and neurologist. This is due to our policy as well as the cause of your pain (tumor). We are not neurologists and we are not psychiatrists. You have been referred to both in the past. If you need to be referred again we are happy to assist you with that.

## 2013-08-09 NOTE — Progress Notes (Signed)
Patient is here for an office visit. Patient suffers from sever anxiety. Patient complains that he recently had a severe episode of anxiety x4 days prior. Patient may need a psychiatrist referral due to the lost of his family. Patient still taking oxycodone. Wants a medication review on anxiety medication and a check up. Patient feels that dosage isn't working.

## 2013-08-10 ENCOUNTER — Telehealth: Payer: Self-pay

## 2013-08-10 NOTE — Telephone Encounter (Signed)
Patient called wanting refills on his medication i explained to him our policy on narcotics Stated he owes the specialists money and cant go there Just wants [place] to refill his percocet and clonopin Per Dr Doreene Burke can not refill medications

## 2015-07-15 ENCOUNTER — Encounter (HOSPITAL_BASED_OUTPATIENT_CLINIC_OR_DEPARTMENT_OTHER): Payer: Self-pay

## 2015-07-15 ENCOUNTER — Emergency Department (HOSPITAL_BASED_OUTPATIENT_CLINIC_OR_DEPARTMENT_OTHER)
Admission: EM | Admit: 2015-07-15 | Discharge: 2015-07-16 | Disposition: A | Discharge status: Home / Self Care | Payer: Self-pay | Attending: Emergency Medicine | Admitting: Emergency Medicine

## 2015-07-15 ENCOUNTER — Emergency Department (HOSPITAL_BASED_OUTPATIENT_CLINIC_OR_DEPARTMENT_OTHER): Payer: Self-pay

## 2015-07-15 DIAGNOSIS — Z85841 Personal history of malignant neoplasm of brain: Secondary | ICD-10-CM | POA: Insufficient documentation

## 2015-07-15 DIAGNOSIS — Z87891 Personal history of nicotine dependence: Secondary | ICD-10-CM | POA: Insufficient documentation

## 2015-07-15 DIAGNOSIS — I10 Essential (primary) hypertension: Secondary | ICD-10-CM | POA: Insufficient documentation

## 2015-07-15 DIAGNOSIS — W228XXA Striking against or struck by other objects, initial encounter: Secondary | ICD-10-CM | POA: Insufficient documentation

## 2015-07-15 DIAGNOSIS — Y9389 Activity, other specified: Secondary | ICD-10-CM | POA: Insufficient documentation

## 2015-07-15 DIAGNOSIS — Y9289 Other specified places as the place of occurrence of the external cause: Secondary | ICD-10-CM | POA: Insufficient documentation

## 2015-07-15 DIAGNOSIS — Z8659 Personal history of other mental and behavioral disorders: Secondary | ICD-10-CM | POA: Insufficient documentation

## 2015-07-15 DIAGNOSIS — Y998 Other external cause status: Secondary | ICD-10-CM | POA: Insufficient documentation

## 2015-07-15 DIAGNOSIS — L03818 Cellulitis of other sites: Secondary | ICD-10-CM | POA: Insufficient documentation

## 2015-07-15 NOTE — ED Notes (Signed)
Ice pack given to apply to back of head/neck

## 2015-07-15 NOTE — ED Notes (Signed)
Hit back on head on car door x 2 on Thursday,  Saturday noticed a swollen area w a scab  Today states it is burning,  Area swollen red and w scaf

## 2015-07-15 NOTE — ED Notes (Signed)
Hit back of neck on car door frame last Thursday-denies LOC with injury-NAD-steady gait

## 2015-07-16 MED ORDER — CEPHALEXIN 500 MG PO CAPS: 500.0000 mg | ORAL_CAPSULE | Freq: Four times a day (QID) | ORAL | Status: AC

## 2015-07-16 MED ORDER — IBUPROFEN 800 MG PO TABS: 800.0000 mg | ORAL_TABLET | Freq: Three times a day (TID) | ORAL | Status: AC

## 2015-07-16 MED ORDER — TETANUS-DIPHTH-ACELL PERTUSSIS 5-2.5-18.5 LF-MCG/0.5 IM SUSP
0.5000 mL | Freq: Once | INTRAMUSCULAR | Status: AC
  Administered 2015-07-16: 0.5 mL via INTRAMUSCULAR
  Filled 2015-07-16: qty 0.5

## 2015-07-16 NOTE — ED Provider Notes (Signed)
CSN: [id number]     Arrival date & time 07/15/15  1957 History   First MD Initiated Contact with Patient 07/16/15 0003     Chief Complaint  Patient presents with  . Neck Injury     (Consider location/radiation/quality/duration/timing/severity/associated sxs/prior Treatment) HPI   Maurice Clay is a 50 y.o. male with PMH significant for HTN, anxiety who presents with posterior neck pain.  Patient reports he hit his neck on a car door frame 5 days ago.  He reports he scratched his head and may of gotten it infected.  He is now complaining of constant, burning, moderate, redness, and swelling to the posterior neck.  Denies drainage, fever, chills, N/V, abdominal pain, numbness, weakness.  Aggravating factors include palpation.  He has taken goody powder with moderate relief.  No hx of MRSA.   Past Medical History  Diagnosis Date  . Brain tumor ([place])   . Hypertension   . Anxiety    History reviewed. No pertinent past surgical history. No family history on file. Social History  Substance Use Topics  . Smoking status: Former Smoker -- 0.00 packs/day  . Smokeless tobacco: None  . Alcohol Use: No    Review of Systems  All other systems negative unless otherwise stated in HPI   Allergies  Darvocet and Tramadol  Home Medications   Prior to Admission medications   Medication Sig Start Date End Date Taking? Authorizing Provider  cephALEXin (KEFLEX) 500 MG capsule Take 1 capsule (500 mg total) by mouth 4 (four) times daily. 07/16/15   Gloriann Loan, PA-C  ibuprofen (ADVIL,MOTRIN) 800 MG tablet Take 1 tablet (800 mg total) by mouth 3 (three) times daily. 07/16/15   Annisha Baar, PA-C   BP 122/89 mmHg  Pulse 75  Temp(Src) 98.6 F (37 C) (Oral)  Resp 18  Ht 5\' 10"  (1.778 m)  Wt 58.968 kg  BMI 18.65 kg/m2  SpO2 100% Physical Exam  Constitutional: He is oriented to person, place, and time. He appears well-developed and well-nourished.  HENT:  Head: Normocephalic and atraumatic.  Right  Ear: External ear normal.  Left Ear: External ear normal.  Mouth/Throat: No oropharyngeal exudate.  Eyes: Conjunctivae are normal. No scleral icterus.  Neck: No tracheal deviation present.  No cervical midline tenderness.   Pulmonary/Chest: Effort normal. No respiratory distress.  Abdominal: He exhibits no distension.  Musculoskeletal: Normal range of motion.  Neurological: He is alert and oriented to person, place, and time.  Skin: Skin is warm and dry. There is erythema.  2x2 cm area of induration and warmth on anterior posterior spine just below occipital scalp.  No drainage.  No fluctuance.    Psychiatric: He has a normal mood and affect. His behavior is normal.    ED Course  Procedures (including critical care time) Labs Review Labs Reviewed - No data to display  Imaging Review Dg Cervical Spine Complete  07/15/2015  CLINICAL DATA:  Patient hit head on car door today.  Pain. EXAM: CERVICAL SPINE - COMPLETE 4+ VIEW COMPARISON:  None. FINDINGS: There is no evidence of cervical spine fracture or prevertebral soft tissue swelling. Alignment is normal. No other significant bone abnormalities are identified. Slight disc space narrowing C4-5. Poor dentition. IMPRESSION: Negative cervical spine radiographs.  Poor dentition. Electronically Signed   By: Staci Righter M.D.   On: 07/15/2015 21:29   I have personally reviewed and evaluated these images and lab results as part of my medical decision-making.   EKG Interpretation None  MDM   Final diagnoses:  Cellulitis of other specified site    Patient presents with findings consistent with cellulitis.  No fluctuance.  No evidence of abscess.  No indication for I&D at this time.  VSS, NAD.  Tdap updated in ED.  Plan to discharge home with keflex and ibuprofen.  Discussed return precautions.  Follow up PCP.  Patient agrees and acknowledges the above plan for discharge.    Gloriann Loan, PA-C [id number] [phone]  Delora Fuel, MD [id number]  [phone]

## 2015-07-16 NOTE — Discharge Instructions (Signed)

## 2016-10-26 IMAGING — CR DG CERVICAL SPINE COMPLETE 4+V
5 series · 5 of 5 positions shown · non-contrast
Comparison: None.

CLINICAL DATA: Patient hit head on car door today.  Pain.

EXAM:
CERVICAL SPINE - COMPLETE 4+ VIEW

[w c-spine a.p.]
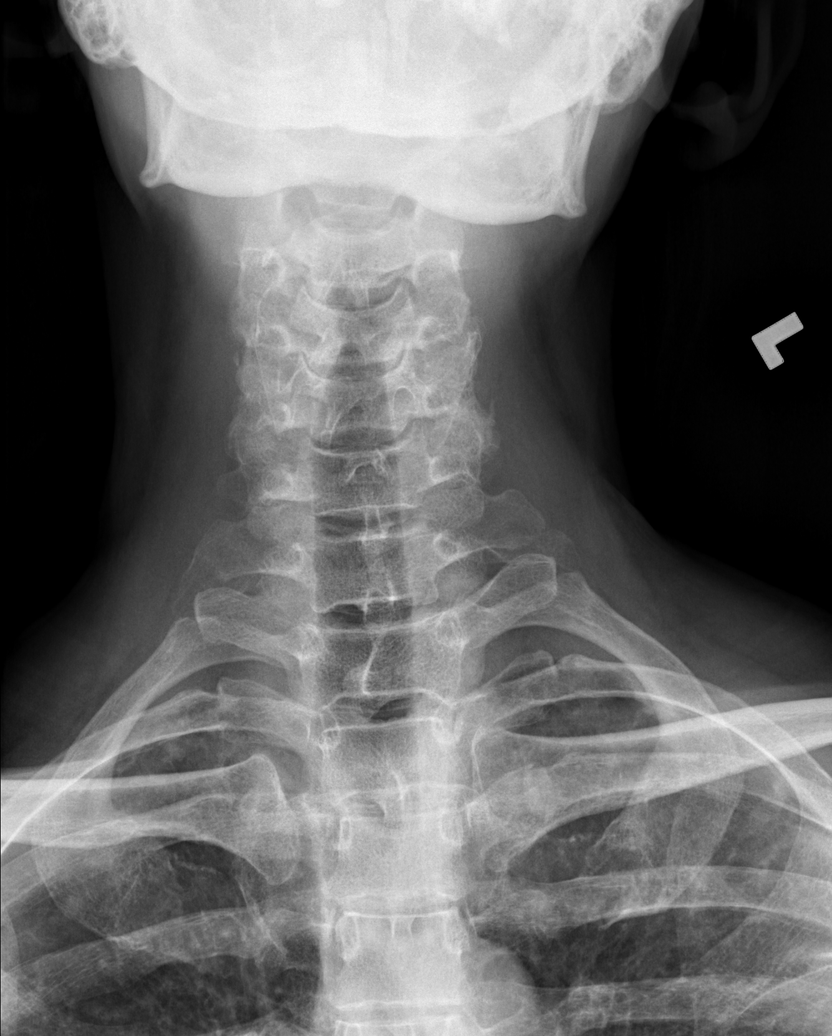

[w c-spine oblique (1 of 2)]
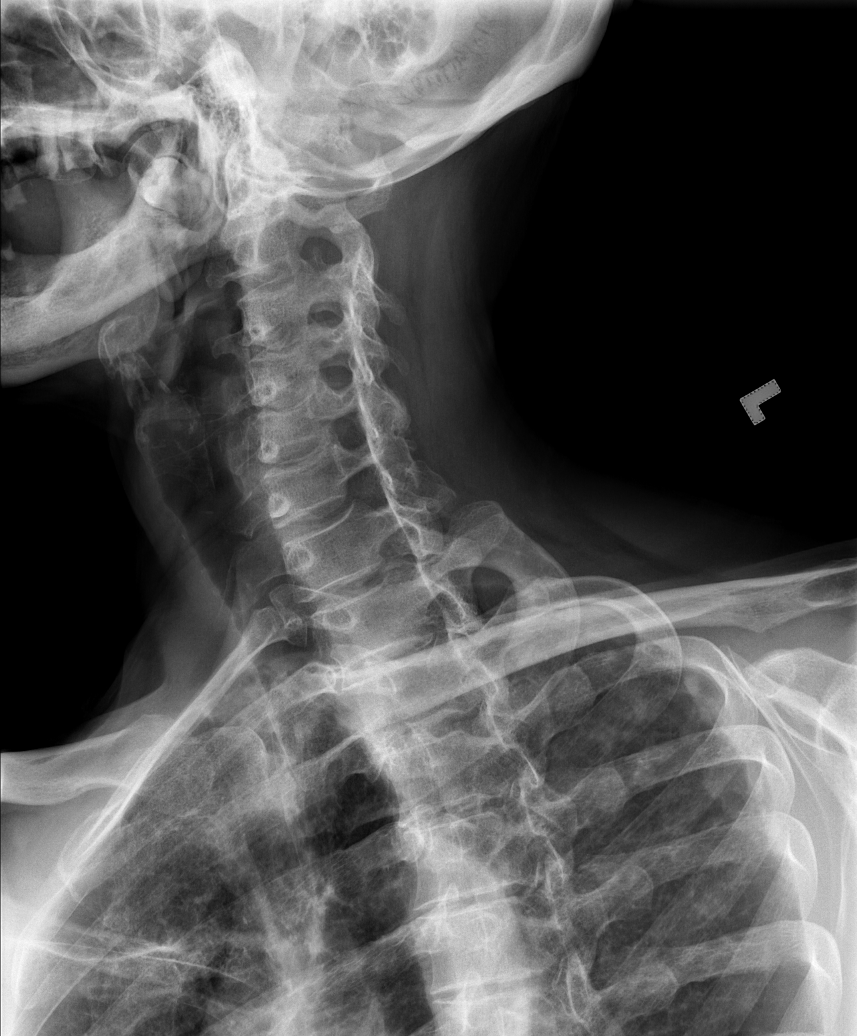

[w c-spine oblique (2 of 2)]
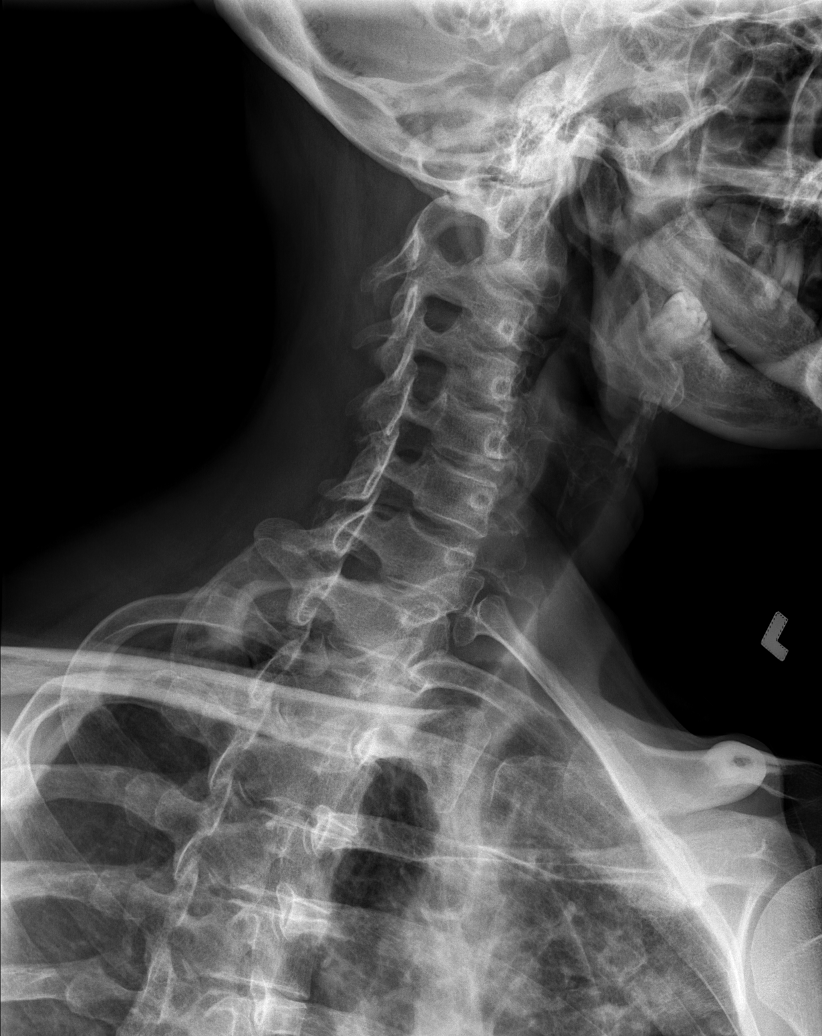

[w c-spine lat]
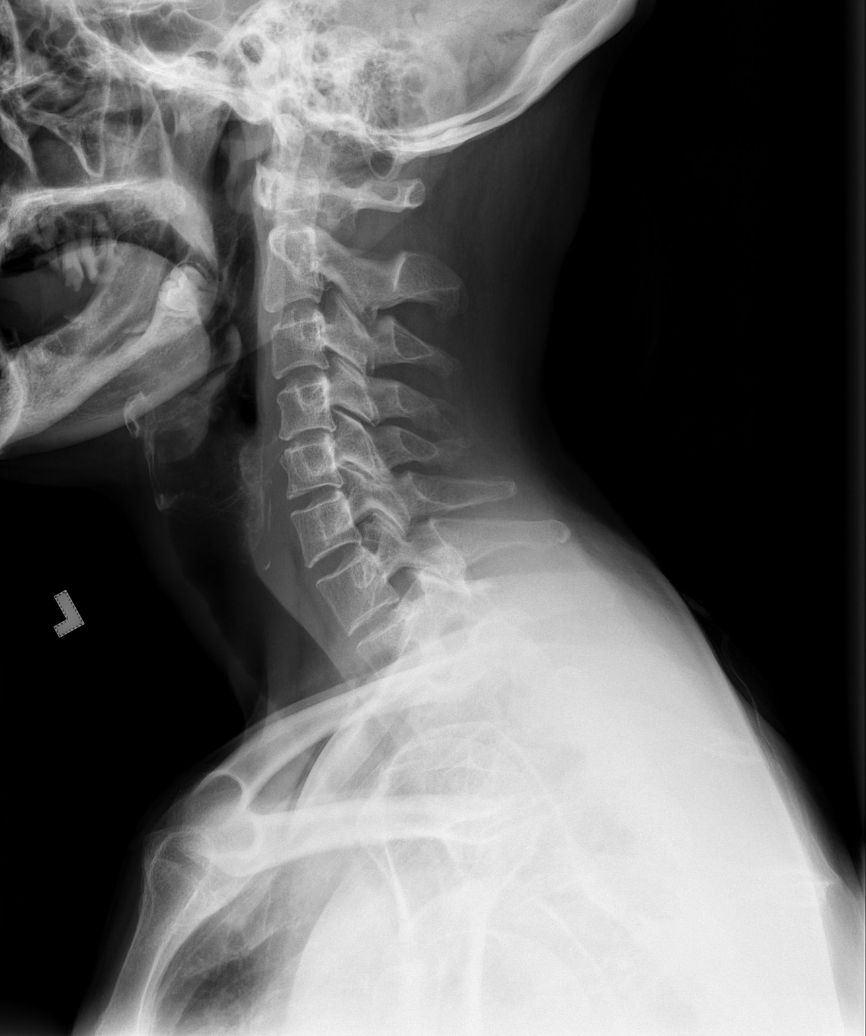

[w c-spine odontoid]
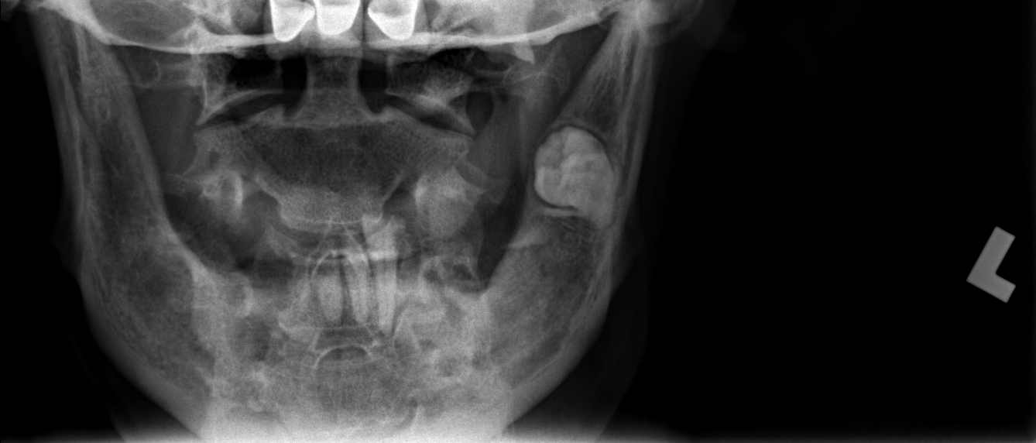

[5 of 5 positions shown; findings below may reference images not displayed]

FINDINGS: There is no evidence of cervical spine fracture or prevertebral soft
tissue swelling. Alignment is normal. No other significant bone
abnormalities are identified. Slight disc space narrowing C4-5. Poor
dentition.
IMPRESSION: Negative cervical spine radiographs.  Poor dentition.
